# Patient Record
Sex: Male | Born: 1960 | Race: White | Hispanic: No | Marital: Married | Smoking: Never smoker
Health system: Southern US, Community
[De-identification: ages and names within clinical notes are randomized; demographics above are authoritative.]

## PROBLEM LIST (undated history)

## (undated) DIAGNOSIS — S62522A Displaced fracture of distal phalanx of left thumb, initial encounter for closed fracture: Secondary | ICD-10-CM

## (undated) DIAGNOSIS — J309 Allergic rhinitis, unspecified: Secondary | ICD-10-CM

## (undated) DIAGNOSIS — R0683 Snoring: Secondary | ICD-10-CM

## (undated) DIAGNOSIS — T884XXA Failed or difficult intubation, initial encounter: Secondary | ICD-10-CM

## (undated) DIAGNOSIS — T7840XA Allergy, unspecified, initial encounter: Secondary | ICD-10-CM

## (undated) DIAGNOSIS — N529 Male erectile dysfunction, unspecified: Secondary | ICD-10-CM

## (undated) DIAGNOSIS — K4090 Unilateral inguinal hernia, without obstruction or gangrene, not specified as recurrent: Secondary | ICD-10-CM

## (undated) DIAGNOSIS — S0285XA Fracture of orbit, unspecified, initial encounter for closed fracture: Secondary | ICD-10-CM

## (undated) DIAGNOSIS — K5190 Ulcerative colitis, unspecified, without complications: Secondary | ICD-10-CM

## (undated) DIAGNOSIS — E785 Hyperlipidemia, unspecified: Secondary | ICD-10-CM

## (undated) DIAGNOSIS — Z8042 Family history of malignant neoplasm of prostate: Secondary | ICD-10-CM

## (undated) DIAGNOSIS — S62109A Fracture of unspecified carpal bone, unspecified wrist, initial encounter for closed fracture: Secondary | ICD-10-CM

## (undated) DIAGNOSIS — K219 Gastro-esophageal reflux disease without esophagitis: Secondary | ICD-10-CM

## (undated) DIAGNOSIS — Z87442 Personal history of urinary calculi: Secondary | ICD-10-CM

## (undated) DIAGNOSIS — S62102A Fracture of unspecified carpal bone, left wrist, initial encounter for closed fracture: Secondary | ICD-10-CM

## (undated) HISTORY — DX: Ulcerative colitis, unspecified, without complications: K51.90

## (undated) HISTORY — DX: Allergic rhinitis, unspecified: J30.9

## (undated) HISTORY — DX: Failed or difficult intubation, initial encounter: T88.4XXA

## (undated) HISTORY — DX: Male erectile dysfunction, unspecified: N52.9

## (undated) HISTORY — DX: Personal history of urinary calculi: Z87.442

## (undated) HISTORY — DX: Hyperlipidemia, unspecified: E78.5

## (undated) HISTORY — DX: Allergy, unspecified, initial encounter: T78.40XA

## (undated) HISTORY — DX: Gastro-esophageal reflux disease without esophagitis: K21.9

## (undated) HISTORY — DX: Unilateral inguinal hernia, without obstruction or gangrene, not specified as recurrent: K40.90

## (undated) HISTORY — PX: VASECTOMY: SHX75

## (undated) HISTORY — PX: ORIF WRIST FRACTURE: SHX2133

## (undated) HISTORY — DX: Displaced fracture of distal phalanx of left thumb, initial encounter for closed fracture: S62.522A

## (undated) HISTORY — DX: Family history of malignant neoplasm of prostate: Z80.42

## (undated) HISTORY — PX: TONSILLECTOMY AND ADENOIDECTOMY: SUR1326

## (undated) HISTORY — DX: Snoring: R06.83

## (undated) HISTORY — DX: Fracture of unspecified carpal bone, left wrist, initial encounter for closed fracture: S62.102A

---

## 1988-12-29 DIAGNOSIS — K5190 Ulcerative colitis, unspecified, without complications: Secondary | ICD-10-CM

## 1988-12-29 HISTORY — DX: Ulcerative colitis, unspecified, without complications: K51.90

## 1997-07-14 ENCOUNTER — Encounter: Payer: Self-pay | Admitting: Internal Medicine

## 2000-11-24 ENCOUNTER — Ambulatory Visit (HOSPITAL_COMMUNITY): Admission: RE | Admit: 2000-11-24 | Discharge: 2000-11-24 | Payer: Self-pay | Admitting: Gastroenterology

## 2000-11-24 ENCOUNTER — Encounter (INDEPENDENT_AMBULATORY_CARE_PROVIDER_SITE_OTHER): Payer: Self-pay | Admitting: *Deleted

## 2000-11-24 ENCOUNTER — Encounter: Payer: Self-pay | Admitting: Internal Medicine

## 2004-07-29 ENCOUNTER — Emergency Department (HOSPITAL_COMMUNITY): Admission: EM | Admit: 2004-07-29 | Discharge: 2004-07-29 | Payer: Self-pay

## 2007-12-30 HISTORY — PX: URETEROSCOPY: SHX842

## 2008-03-05 ENCOUNTER — Emergency Department (HOSPITAL_COMMUNITY): Admission: EM | Admit: 2008-03-05 | Discharge: 2008-03-05 | Payer: Self-pay | Admitting: Emergency Medicine

## 2008-06-29 ENCOUNTER — Ambulatory Visit (HOSPITAL_BASED_OUTPATIENT_CLINIC_OR_DEPARTMENT_OTHER): Admission: RE | Admit: 2008-06-29 | Discharge: 2008-06-29 | Payer: Self-pay | Admitting: Urology

## 2009-02-07 ENCOUNTER — Encounter: Admission: RE | Admit: 2009-02-07 | Discharge: 2009-02-07 | Payer: Self-pay | Admitting: Internal Medicine

## 2009-11-13 ENCOUNTER — Encounter: Payer: Self-pay | Admitting: Internal Medicine

## 2009-12-29 HISTORY — PX: COLONOSCOPY: SHX174

## 2009-12-29 HISTORY — PX: POLYPECTOMY: SHX149

## 2010-05-13 ENCOUNTER — Encounter (INDEPENDENT_AMBULATORY_CARE_PROVIDER_SITE_OTHER): Payer: Self-pay | Admitting: *Deleted

## 2010-06-20 DIAGNOSIS — Z87442 Personal history of urinary calculi: Secondary | ICD-10-CM | POA: Insufficient documentation

## 2010-06-20 DIAGNOSIS — M25519 Pain in unspecified shoulder: Secondary | ICD-10-CM | POA: Insufficient documentation

## 2010-06-20 DIAGNOSIS — K5190 Ulcerative colitis, unspecified, without complications: Secondary | ICD-10-CM | POA: Insufficient documentation

## 2010-06-26 ENCOUNTER — Ambulatory Visit: Payer: Self-pay | Admitting: Internal Medicine

## 2010-07-03 ENCOUNTER — Telehealth: Payer: Self-pay | Admitting: Internal Medicine

## 2010-07-09 ENCOUNTER — Ambulatory Visit: Payer: Self-pay | Admitting: Internal Medicine

## 2010-07-14 ENCOUNTER — Encounter: Payer: Self-pay | Admitting: Internal Medicine

## 2010-08-07 ENCOUNTER — Telehealth (INDEPENDENT_AMBULATORY_CARE_PROVIDER_SITE_OTHER): Payer: Self-pay | Admitting: *Deleted

## 2011-01-30 NOTE — Assessment & Plan Note (Signed)
Summary: ULCERATIVE COLITIS...establish   History of Present Illness Visit Type: consult Primary GI MD: Yancey Flemings MD Primary Provider: Rodrigo Ran, MD Requesting Provider: Rodrigo Ran, MD Chief Complaint: Pt has a history of UC for 20 yrs with Dr. Sherin Quarry. Pt hasn't had any problems with flares for 10 years but needs a GI doctor for insurance purposes. Pt denies any sx.  History of Present Illness:   22 or old white male diagnosed with ulcerative colitis in 1991 by Dr. Tasia Catchings. Initial symptoms were classic and included abdominal cramping with increased frequency, urgency, and bloody stools. He was initially treated with Azulfidine and prednisone. Allergic reaction to Azulfidine necessitated changing to Dipentum and subsequently Asacol. Review of outside records reveals a colonoscopy in 1998 for screening purposes. He was found to have active disease from the rectum to mid transverse colon. Biopsies confirmed the same without dysplasia. Repeat colonoscopy in November of 2001 revealed active disease from the rectum to mid transverse colon. However biopsies throughout the entire colon showed changes of chronic ulcerative colitis. Pale he has been doing well for multiple years. States she's been on no medications for greater than 5 years. States he said the symptoms for greater than 5 years. He recently wanted to apply for health insurance, but was denied because he was not receiving medical care for his ulcerative colitis. He presents now to establish that care. Review of outside laboratories from November 2010 revealed normal CBC, normal basic metabolic panel, normal lipid profile, normal TSH, normal PSA, normal urinalysis. He denies any extraintestinal manifestations of inflammatory bowel disease. His brother has Crohn's disease. No family history of colon cancer.Marland Kitchen   GI Review of Systems      Denies abdominal pain, acid reflux, belching, bloating, chest pain, dysphagia with liquids, dysphagia  with solids, heartburn, loss of appetite, nausea, vomiting, vomiting blood, weight loss, and  weight gain.        Denies anal fissure, black tarry stools, change in bowel habit, constipation, diarrhea, diverticulosis, fecal incontinence, heme positive stool, hemorrhoids, irritable bowel syndrome, jaundice, light color stool, liver problems, rectal bleeding, and  rectal pain.    Current Medications (verified): 1)  None  Allergies (verified): 1)  ! Sulfa 2)  ! 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  Alcohol Use - no Illicit Drug Use - no seperated with 2 children  Review of Systems  The patient denies allergy/sinus, anemia, anxiety-new, arthritis/joint pain, back pain, blood in urine, breast changes/lumps, change in vision, confusion, cough, coughing up blood, depression-new, fainting, fatigue, fever, headaches-new, hearing problems, heart murmur, heart rhythm changes, itching, menstrual pain, muscle pains/cramps, night sweats, nosebleeds, pregnancy symptoms, shortness of breath, skin rash, sleeping problems, sore throat, swelling of feet/legs, swollen lymph glands, thirst - excessive , urination - excessive , urination changes/pain, urine leakage, vision changes, and voice change.    Vital Signs:  Patient profile:   50 year old male Height:      71 inches Weight:      174.38 pounds BMI:     24.41 Pulse rate:   74 / minute Pulse rhythm:   regular BP sitting:   112 / 70  (left arm) Cuff size:   regular  Vitals Entered By: Christie Nottingham CMA Duncan Dull) (June 26, 2010 2:44  PM)  Physical Exam  General:  Well developed, well nourished, no acute distress. Head:  Normocephalic and atraumatic. Eyes:  PERRLA, no icterus. Ears:  Normal auditory acuity. Nose:  No deformity, discharge,  or lesions. Mouth:  No deformity or lesions, dentition normal. Neck:  Supple; no masses or thyromegaly. Lungs:  Clear throughout to auscultation. Heart:  Regular rate and rhythm; no murmurs, rubs,  or bruits. Abdomen:  Soft, nontender and nondistended. No masses, hepatosplenomegaly or hernias noted. Normal bowel sounds. Rectal:  deferred until colonoscopy Prostate:  deferred until colonoscopy Msk:  Symmetrical with no gross deformities. Normal posture. Pulses:  Normal pulses noted. Extremities:  No clubbing, cyanosis, edema or deformities noted. Neurologic:  Alert and  oriented x4;  grossly normal neurologically. Skin:  Intact without significant lesions or rashes. Psych:  Alert and cooperative. Normal mood and affect.   Impression & Recommendations:  Problem # 1:  ULCERATIVE COLITIS (ICD-556.9) Universal ulcerative colitis diagnosed 20 years ago. The patient had intermittent flares requiring medical therapy. Prior colonoscopies as described. Currently asymptomatic off medical therapy. Normal laboratories last fall. Detailed discussion today on ulcerative colitis as well as it spectrum of disease. Discussed the potential benefits of long-term medical therapy with aminosalicylates. Also discussed the importance of periodic surveillance in patients with long-standing Universal ulcerative colitis. Also discussed the importance of education.  Plan: #1. Reference to Crohn's and colitis Foundation of [place] provided #2. Literature on Crohn's disease and Crohn's medications provided for review #3. Schedule screening colonoscopy with biopsies. The nature of the procedure as well as the risks, benefits, and alternatives were reviewed. He understood and agreed to proceed. #4. Movi prep  prescribed. The patient instructed on its use. #5. Office followup after the above completed  Problem # 2:  SCREENING COLORECTAL-CANCER (ICD-V76.51) high-risk screening to be provided via colonoscopy  Other Orders: Colonoscopy (Colon)  Patient Instructions: 1)  Colonoscopy [facility] 07/09/10 3:30 pm  arrive at 2:30 pm 2)  Movi prep instructions given to patient. 3)  Movi prep Rx. sent to your pharmacy. 4)  Colitis information given. 5)  Copy sent to : Rodrigo Ran, MD 6)  The medication list was reviewed and reconciled.  All changed / newly prescribed medications were explained.  A complete medication list was provided to the patient / caregiver. Prescriptions: MOVIPREP 100 GM  SOLR (PEG-KCL-NACL-NASULF-NA ASC-C) As per prep instructions.  #1 x 0   Entered by:   Milford Cage NCMA   Authorized by:   Hilarie Fredrickson MD   Signed by:   Milford Cage NCMA on 06/26/2010   Method used:   Electronically to        [facility]. [phone]* (retail)       [address]       [place], [place]  [address]       Ph: [phone]       Fax: [phone]   RxID:   [id number]

## 2011-01-30 NOTE — Progress Notes (Signed)
  Phone Note Other Incoming   Request: Send information Summary of Call: Request for records received from [facility]. Request forwarded to [facility].     

## 2011-01-30 NOTE — Miscellaneous (Signed)
Summary: Lialda Rx for colitis  Clinical Lists Changes  Medications: Added new medication of LIALDA 1.2 GM  TBEC (MESALAMINE) one by mouth bid - Signed Rx of LIALDA 1.2 GM  TBEC (MESALAMINE) one by mouth bid;  #60 x 11;  Signed;  Entered by: Doristine Church RN II;  Authorized by: Hilarie Fredrickson MD;  Method used: Electronically to [facility]. [id number]*, [address] [place], [place], [place]  [address], Ph: [phone], Fax: [phone] Observations: Added new observation of ALLERGY REV: Done (07/09/2010 17:35)    Prescriptions: LIALDA 1.2 GM  TBEC (MESALAMINE) one by mouth bid  #60 x 11   Entered by:   Doristine Church RN II   Authorized by:   Hilarie Fredrickson MD   Signed by:   Doristine Church RN II on 07/09/2010   Method used:   Electronically to        [facility]. [phone]* (retail)       [address]       [place], [place]  [address]       Ph: [phone]       Fax: [phone]   RxID:   [id number]

## 2011-01-30 NOTE — Letter (Signed)
Summary: New Patient letter  [facility]  [address] [place], [place] [address]   Phone: [phone]  Fax: [phone]       05/13/2010 MRN: [id number]  Joel Burke [address] [place], [place]  [address]  Dear Mr. Messing,  Welcome to the Gastroenterology Division at [facility].    You are scheduled to see Dr. Marina Goodell on 06/05/2010 at 3:45PM on the 3rd floor at [facility], [address]  We ask that you try to arrive at our office 15 minutes prior to your appointment time to allow for check-in.  We would like you to complete the enclosed self-administered evaluation form prior to your visit and bring it with you on the day of your appointment.  We will review it with you.  Also, please bring a complete list of all your medications or, if you prefer, bring the medication bottles and we will list them.  Please bring your insurance card so that we may make a copy of it.  If your insurance requires a referral to see a specialist, please bring your referral form from your primary care physician.  Co-payments are due at the time of your visit and may be paid by cash, check or credit card.     Your office visit will consist of a consult with your physician (includes a physical exam), any laboratory testing he/she may order, scheduling of any necessary diagnostic testing (e.g. x-ray, ultrasound, CT-scan), and scheduling of a procedure (e.g. Endoscopy, Colonoscopy) if required.  Please allow enough time on your schedule to allow for any/all of these possibilities.    If you cannot keep your appointment, please call [phone] to cancel or reschedule prior to your appointment date.  This allows [place] the opportunity to schedule an appointment for another patient in need of care.  If you do not cancel or reschedule by 5 p.m. the business day prior to your appointment date, you will be charged a $50.00 late cancellation/no-show fee.    Thank you for choosing  [facility] for your medical needs.  We appreciate the opportunity to care for you.  Please visit [place] at our website  to learn more about our practice.                     Sincerely,                                                             The Gastroenterology Division

## 2011-01-30 NOTE — Letter (Signed)
Summary: [facility] Instructions  [facility]  [address] [place], [place] [address]   Phone: [phone]  Fax: [phone]       Joel Burke    1961/04/01    MRN: [id number]        Procedure Day /Date:TUESDAY, 07/09/10     Arrival Time:2:30 PM     Procedure Time:3:30 PM     Location of Procedure:                    [facility] (4th Floor)                        PREPARATION FOR COLONOSCOPY WITH MOVIPREP   Starting 5 days prior to your procedure 07/04/10 do not eat nuts, seeds, popcorn, corn, beans, peas,  salads, or any raw vegetables.  Do not take any fiber supplements (e.g. Metamucil, Citrucel, and Benefiber).  THE DAY BEFORE YOUR PROCEDURE         DATE: 07/08/10  DAY: MONDAY  1.  Drink clear liquids the entire day-NO SOLID FOOD  2.  Do not drink anything colored red or purple.  Avoid juices with pulp.  No orange juice.  3.  Drink at least 64 oz. (8 glasses) of fluid/clear liquids during the day to prevent dehydration and help the prep work efficiently.  CLEAR LIQUIDS INCLUDE: Water Jello Ice Popsicles Tea (sugar ok, no milk/cream) Powdered fruit flavored drinks Coffee (sugar ok, no milk/cream) Gatorade Juice: apple, white grape, white cranberry  Lemonade Clear bullion, consomm, broth Carbonated beverages (any kind) Strained chicken noodle soup Hard Candy                             4.  In the morning, mix first dose of MoviPrep solution:    Empty 1 Pouch A and 1 Pouch B into the disposable container    Add lukewarm drinking water to the top line of the container. Mix to dissolve    Refrigerate (mixed solution should be used within 24 hrs)  5.  Begin drinking the prep at 5:00 p.m. The MoviPrep container is divided by 4 marks.   Every 15 minutes drink the solution down to the next mark (approximately 8 oz) until the full liter is complete.   6.  Follow completed prep with 16 oz of clear liquid of your choice (Nothing red or  purple).  Continue to drink clear liquids until bedtime.  7.  Before going to bed, mix second dose of MoviPrep solution:    Empty 1 Pouch A and 1 Pouch B into the disposable container    Add lukewarm drinking water to the top line of the container. Mix to dissolve    Refrigerate  THE DAY OF YOUR PROCEDURE      DATE:07/09/10 DAY: TUESDAY  Beginning at 10:30 a.m. (5 hours before procedure):         1. Every 15 minutes, drink the solution down to the next mark (approx 8 oz) until the full liter is complete.  2. Follow completed prep with 16 oz. of clear liquid of your choice.    3. You may drink clear liquids until1:30 PM (2 HOURS BEFORE PROCEDURE).   MEDICATION INSTRUCTIONS  Unless otherwise instructed, you should take regular prescription medications with a small sip of water   as early as possible the morning of your procedure.  OTHER INSTRUCTIONS  You will need a responsible adult at least 48 years of age to accompany you and drive you home.   This person must remain in the waiting room during your procedure.  Wear loose fitting clothing that is easily removed.  Leave jewelry and other valuables at home.  However, you may wish to bring a book to read or  an iPod/MP3 player to listen to music as you wait for your procedure to start.  Remove all body piercing jewelry and leave at home.  Total time from sign-in until discharge is approximately 2-3 hours.  You should go home directly after your procedure and rest.  You can resume normal activities the  day after your procedure.  The day of your procedure you should not:   Drive   Make legal decisions   Operate machinery   Drink alcohol   Return to work  You will receive specific instructions about eating, activities and medications before you leave.    The above instructions have been reviewed and explained to me by   _______________________    I fully understand and can verbalize these  instructions _____________________________ Date _________

## 2011-01-30 NOTE — Progress Notes (Signed)
Summary: Primary DX Code  Phone Note Call from Patient Call back at Home Phone [phone]   Caller: Patient Call For: Dr Marina Goodell Reason for Call: Talk to Nurse, Talk to Doctor, Insurance Question Details for Reason: Procedure Coding Summary of Call: Pt called with numerous questions regarding billing, after answering all questions, patient's concern is the primary diagnosis code. Pt states that at office visit, he was told this was a "screening" colonoscopy (V76.51); however, the order reflects ulcerative colitis (556.1) as primary. I advised that often we cannot simply change codes because they are determined based on medical history, but advised patient I would defer to Dr Marina Goodell and his nurse. Please advise. Initial call taken by: Dwan Bolt,  July 03, 2010 1:05 PM  Follow-up for Phone Call        Morrie Sheldon, the patient is correct. The primary code should be V76.51screening. The ulcerative colitis code (556.1) should be secondary. Please contact the patient and let him know. Please correct the coding, if it needs corrected. If there are any further problems, let me know. Thanks. Follow-up by: Hilarie Fredrickson MD,  July 03, 2010 1:49 PM  Additional Follow-up for Phone Call Additional follow up Details #1::        Spoke with patient, verbalized understanding. Benefit information updated to reflect V76.51 as primary dx code. Additional Follow-up by: Dwan Bolt,  July 03, 2010 2:12 PM

## 2011-01-30 NOTE — Procedures (Signed)
Summary: Colonoscopy/[place]  Colonoscopy/[place]   Imported By: Sherian Rein 07/04/2010 07:30:18  _____________________________________________________________________  External Attachment:    Type:   Image     Comment:   External Document

## 2011-01-30 NOTE — Procedures (Signed)
Summary: Colonoscopy/[place]  Colonoscopy/[place]   Imported By: Sherian Rein 07/04/2010 07:32:27  _____________________________________________________________________  External Attachment:    Type:   Image     Comment:   External Document

## 2011-01-30 NOTE — Procedures (Signed)
Summary: Colonoscopy  Patient: [name] Note: All result statuses are Final unless otherwise noted.  Tests: (1) Colonoscopy (COL)   COL Colonoscopy           DONE     [facility]     [address]     [place], [place]  [address]           COLONOSCOPY PROCEDURE REPORT           PATIENT:  Joel Burke, Joel Burke  MR#:  [id number]     BIRTHDATE:  Mar 29, 1961, 48 yrs. old  GENDER:  male     ENDOSCOPIST:  Wilhemina Bonito. Eda Keys, MD     REF. BY:  Office / Self     PROCEDURE DATE:  07/09/2010     PROCEDURE:  Colonoscopy with biopsies,     Colonoscopy with snare polypectomy     x 2     ASA CLASS:  Class II     INDICATIONS:  screening, evaluation of ulcerative colitis ; Dx pan     ulcerative colitis in 1991; last colonoscopy 2001     MEDICATIONS:   Fentanyl 100 mcg IV, Versed 9 mg IV           DESCRIPTION OF PROCEDURE:   After the risks benefits and     alternatives of the procedure were thoroughly explained, informed     consent was obtained.  Digital rectal exam was performed and     revealed no abnormalities.   The LB [id number] endoscope     was introduced through the anus and advanced to the cecum, which     was identified by both the appendix and ileocecal valve, without     limitations.Time to cecum = 2:06 min.  The quality of the prep was     excellent, using MoviPrep.  The instrument was then slowly     withdrawn (time = 19:04 min) as the colon was fully examined.     <<PROCEDUREIMAGES>>           FINDINGS:  The terminal ileum was intubated x 15cm and  appeared     normal.  A mild patchy Colitis was found in the cecum, ascending     colon, and hepatic flexure region.  Two polyps were found - 3mm     sessile in the cecum and 7mm sessile in the ascending colon.     Polyps were snared without cautery. Retrieval was successful.     The transverse colon, splenic flexure, descending, sigmoid colon, and     rectum appeared grossly unremarkable.   Retroflexed views in  the     rectum revealed no abnormalities.MULTIPLE BIOPSIES WERE TAKEN FROM     ALL AREAS OF THE COLON (4Q q 10cm - N=32).    The scope was then     withdrawn from the patient and the procedure completed.           COMPLICATIONS:  None     ENDOSCOPIC IMPRESSION:     1) Normal terminal ileum     2) Mild patchy Colitis in the right colon     3) Two polyps - removed     4) Normal colon (grossly) otherwise           RECOMMENDATIONS:     1) Await pathology results     2) Lialda 1.2  gm; #60; one PO Bid; 11 refills     3) Routine office follow up in 1 year  4) Follow up colonoscopy in about 2 years           ______________________________     Wilhemina Bonito. Eda Keys, MD           CC:  Rodrigo Ran, MD;The Patient           n.     Rosalie DoctorWilhemina Bonito. Eda Keys at 07/09/2010 05:26 PM           [name], [phone]  Note: An exclamation mark (!) indicates a result that was not dispersed into the flowsheet. Document Creation Date: 07/09/2010 5:27 PM _______________________________________________________________________  (1) Order result status: Final Collection or observation date-time: 07/09/2010 17:04 Requested date-time:  Receipt date-time:  Reported date-time:  Referring Physician:   Ordering Physician: Fransico Setters [phone]) Specimen Source:  Source: Launa Grill Order Number: [id number] Lab site:   Appended Document: Colonoscopy     Procedures Next Due Date:    Colonoscopy: 06/2012

## 2011-01-30 NOTE — Letter (Signed)
Summary: Patient Notice- Colon Biospy Results  [facility]  [address] [place], [place] [address]   Phone: [phone]  Fax: [phone]        July 14, 2010 MRN: [id number]    Joel Burke [address] [place], [place]  [address]    Dear [name],  I am pleased to inform you that the biopsies taken during your recent colonoscopy did not show any evidence of dysplasia or microscopic cancer upon pathologic examination.The biopsies did, however, show active chronic inflammation in the right colon (consistent with the findings at colonoscopy).  Also, the polyps that were removed were benign inflammatory type.   Additional information/recommendations:  1. We started Lialda to reduce inflammation  2. Please plan a routine office visit in one year  3.You should have a repeat colonoscopy examination for this problem           in 2 years.     Please call us if you are having persistent problems or have questions about your condition that have not been fully answered at this time.  Sincerely,  Hilarie Fredrickson MD   This letter has been electronically signed by your physician.  Appended Document: Patient Notice- Colon Biospy Results letter mailed.

## 2011-05-13 NOTE — Op Note (Signed)
Joel Burke, Joel Burke             ACCOUNT NO.:  [id number]   MEDICAL RECORD NO.:  [id number]          PATIENT TYPE:  AMB   LOCATION:  [facility]                         FACILITY:  [facility]   PHYSICIAN:  Sigmund I. Patsi Sears, M.D.DATE OF BIRTH:  1961-08-05   DATE OF PROCEDURE:  06/29/2008  DATE OF DISCHARGE:                               OPERATIVE REPORT   PREOPERATIVE DIAGNOSIS:  Distal left ureteral stone.   POSTOPERATIVE DIAGNOSIS:  Distal left ureteral stone.   PROCEDURES:  1. Cystourethroscopy.  2. Left retrograde pyelogram.  3. Intraoperative fluoroscopy with interpretation.  4. Ureteroscopic stone manipulation.  5. Left 6 x 24 double-J ureteral stent placement.   RESIDENT PHYSICIAN:  Dr. Maudie Flakes.   ANESTHESIA:  General/LMA.   INDICATIONS FOR PROCEDURE:  Mr. Stein is a 50 year old white male with  symptomatic left distal ureteral stone, as he has failed medical  expulsive therapy secondary to persistent uncontrolled discomfort.  Preoperatively, all risks, consequences, benefits, and complications  were discussed with him, and informed consent was obtained.   PROCEDURE IN DETAIL:  The patient was brought to the operating room and  placed in the supine position.  He was correctly identified by his  wristband.  Appropriate time-out was taken.  IV antibiotics were given,  and general esthesia was delivered.  Once adequately anesthetized, he  was placed in the dorsal lithotomy position.  Great care was taken to  minimize the risk of peripheral neuropathy or compartment syndrome.  His  perineum was prepped and draped sterilely.  We began our procedure by  performing rigid cystourethroscopy with a 22-French rigid cystoscopic  sheath and a 12 degrees lens.  Normal saline was our irrigant.  Of note,  his urethral meatus was somewhat narrow, and he did have some urethral  meatal bleeding through the case.  The rest of his urethra was normal in  its course and caliber.  Upon  entering the bladder, clear urine was  identified.  Both ureteral orifices were noted to be in their normal  anatomic position, effluxing clear urine.  We cannulated the left  ureteral orifice with a 6-French open-ended catheter and performed  limited retrograde pyelogram.  The stone was not seen.  However, he did  have ureteral dilation down to the level of the UVJ.  We then advanced a  sensor-tip guidewire through the end-hole catheter and directed it to  the left renal pelvis under direct fluoroscopic guidance.  We then  removed the open-ended catheter, leaving the sensor-tip guidewire  behind.  We removed the cystoscope.  We replaced it with a 7.5-French  semi-rigid ureteroscope.  This was advanced through into his left  ureter.  The distal left ureter was very narrow and required the  assistance of a second guidewire to help safely pass through the  intramuscular ureter that was narrow.  Once past this, the ureter became  quite capacious, and the second wire was not necessary.  Retrograde  passage of the semi-rigid ureteroscope to the level of the UPJ did not  demonstrate a stone.  However, slowly backing it out in an antegrade  fashion revealed the  stone that was quite small yet spiculated.  A  nitinol basket was used to grasp it, and it was removed in an antegrade  fashion with.  We then backloaded the sensor-tip guidewire over the  cystoscope, placed the cystoscope back into the bladder, and  subsequently placed a left 6 x 24 double-J ureteral stent under direct  fluoroscopic guidance.  When appropriately positioned, the guidewire was  removed.  The proximal curl was noted to be in the left renal pelvis,  and the distal coil was noted be in the bladder both fluoroscopically  and cystoscopically.  His bladder was drained, and this the cystoscope  was removed.  This marked the end of our procedure.  He tolerate the  procedure well.  There were no complications.  Dr. Patsi Sears was   present and participated in all aspects of the case.     ______________________________  Maudie Flakes, M.D.      Sigmund I. Patsi Sears, M.D.  Electronically Signed    DW/MEDQ  D:  06/29/2008  T:  06/29/2008  Job:  [id number]

## 2011-07-07 DIAGNOSIS — N529 Male erectile dysfunction, unspecified: Secondary | ICD-10-CM | POA: Insufficient documentation

## 2011-07-07 DIAGNOSIS — E785 Hyperlipidemia, unspecified: Secondary | ICD-10-CM | POA: Insufficient documentation

## 2011-07-07 DIAGNOSIS — Z8042 Family history of malignant neoplasm of prostate: Secondary | ICD-10-CM | POA: Insufficient documentation

## 2011-09-22 LAB — I-STAT 8, (EC8 V) (CONVERTED LAB)
Acid-Base Excess: 1
BUN: 22
HCT: 49
Sodium: 141
TCO2: 27
pCO2, Ven: 40.6 — ABNORMAL LOW

## 2011-09-22 LAB — URINALYSIS, ROUTINE W REFLEX MICROSCOPIC
Bilirubin Urine: NEGATIVE
Protein, ur: 30 — AB

## 2011-09-22 LAB — URINE MICROSCOPIC-ADD ON

## 2011-09-22 LAB — URINE CULTURE: Colony Count: NO GROWTH

## 2011-09-25 LAB — POCT HEMOGLOBIN-HEMACUE: Operator id: 268271

## 2012-04-20 ENCOUNTER — Other Ambulatory Visit: Payer: Self-pay | Admitting: Dermatology

## 2012-08-16 ENCOUNTER — Encounter: Payer: Self-pay | Admitting: Internal Medicine

## 2012-10-05 DIAGNOSIS — K219 Gastro-esophageal reflux disease without esophagitis: Secondary | ICD-10-CM | POA: Insufficient documentation

## 2013-04-07 ENCOUNTER — Encounter: Payer: Self-pay | Admitting: Internal Medicine

## 2013-11-08 DIAGNOSIS — K4090 Unilateral inguinal hernia, without obstruction or gangrene, not specified as recurrent: Secondary | ICD-10-CM | POA: Insufficient documentation

## 2013-11-08 DIAGNOSIS — R0683 Snoring: Secondary | ICD-10-CM | POA: Insufficient documentation

## 2014-08-21 ENCOUNTER — Encounter: Payer: Self-pay | Admitting: Internal Medicine

## 2017-01-07 DIAGNOSIS — Z0000 Encounter for general adult medical examination without abnormal findings: Secondary | ICD-10-CM | POA: Diagnosis not present

## 2017-01-07 DIAGNOSIS — Z23 Encounter for immunization: Secondary | ICD-10-CM | POA: Diagnosis not present

## 2017-01-15 DIAGNOSIS — M79671 Pain in right foot: Secondary | ICD-10-CM | POA: Insufficient documentation

## 2017-01-15 DIAGNOSIS — K4090 Unilateral inguinal hernia, without obstruction or gangrene, not specified as recurrent: Secondary | ICD-10-CM | POA: Diagnosis not present

## 2017-01-15 DIAGNOSIS — Z0000 Encounter for general adult medical examination without abnormal findings: Secondary | ICD-10-CM | POA: Diagnosis not present

## 2017-01-15 DIAGNOSIS — Z1212 Encounter for screening for malignant neoplasm of rectum: Secondary | ICD-10-CM | POA: Diagnosis not present

## 2017-01-15 DIAGNOSIS — Z1389 Encounter for screening for other disorder: Secondary | ICD-10-CM | POA: Diagnosis not present

## 2017-01-15 DIAGNOSIS — Z23 Encounter for immunization: Secondary | ICD-10-CM | POA: Diagnosis not present

## 2017-01-15 DIAGNOSIS — M25519 Pain in unspecified shoulder: Secondary | ICD-10-CM | POA: Diagnosis not present

## 2018-01-28 DIAGNOSIS — Z0000 Encounter for general adult medical examination without abnormal findings: Secondary | ICD-10-CM | POA: Diagnosis not present

## 2018-02-04 DIAGNOSIS — M25512 Pain in left shoulder: Secondary | ICD-10-CM | POA: Diagnosis not present

## 2018-02-04 DIAGNOSIS — Z1389 Encounter for screening for other disorder: Secondary | ICD-10-CM | POA: Diagnosis not present

## 2018-02-04 DIAGNOSIS — Z23 Encounter for immunization: Secondary | ICD-10-CM | POA: Diagnosis not present

## 2018-02-04 DIAGNOSIS — M79671 Pain in right foot: Secondary | ICD-10-CM | POA: Diagnosis not present

## 2018-02-04 DIAGNOSIS — K4090 Unilateral inguinal hernia, without obstruction or gangrene, not specified as recurrent: Secondary | ICD-10-CM | POA: Diagnosis not present

## 2018-02-04 DIAGNOSIS — Z0000 Encounter for general adult medical examination without abnormal findings: Secondary | ICD-10-CM | POA: Diagnosis not present

## 2018-02-11 DIAGNOSIS — Z1212 Encounter for screening for malignant neoplasm of rectum: Secondary | ICD-10-CM | POA: Diagnosis not present

## 2019-04-18 DIAGNOSIS — Z125 Encounter for screening for malignant neoplasm of prostate: Secondary | ICD-10-CM | POA: Diagnosis not present

## 2019-04-20 DIAGNOSIS — Z8042 Family history of malignant neoplasm of prostate: Secondary | ICD-10-CM | POA: Diagnosis not present

## 2019-04-20 DIAGNOSIS — Z0000 Encounter for general adult medical examination without abnormal findings: Secondary | ICD-10-CM | POA: Diagnosis not present

## 2019-04-20 DIAGNOSIS — R82998 Other abnormal findings in urine: Secondary | ICD-10-CM | POA: Diagnosis not present

## 2019-04-20 DIAGNOSIS — M79671 Pain in right foot: Secondary | ICD-10-CM | POA: Diagnosis not present

## 2019-04-20 DIAGNOSIS — E785 Hyperlipidemia, unspecified: Secondary | ICD-10-CM | POA: Diagnosis not present

## 2019-05-03 ENCOUNTER — Other Ambulatory Visit: Payer: Self-pay

## 2019-05-03 ENCOUNTER — Encounter: Payer: Self-pay | Admitting: Internal Medicine

## 2019-05-03 ENCOUNTER — Ambulatory Visit: Payer: 59 | Admitting: Internal Medicine

## 2019-05-03 ENCOUNTER — Ambulatory Visit (INDEPENDENT_AMBULATORY_CARE_PROVIDER_SITE_OTHER): Payer: 59 | Admitting: Internal Medicine

## 2019-05-03 VITALS — Ht 71.0 in | Wt 184.0 lb

## 2019-05-03 DIAGNOSIS — Z1211 Encounter for screening for malignant neoplasm of colon: Secondary | ICD-10-CM | POA: Diagnosis not present

## 2019-05-03 DIAGNOSIS — K5100 Ulcerative (chronic) pancolitis without complications: Secondary | ICD-10-CM | POA: Diagnosis not present

## 2019-05-03 NOTE — Progress Notes (Signed)
HISTORY OF PRESENT ILLNESS:  Joel Burke is a 58 y.o. male referred by [name] to reestablish regarding management of chronic ulcerative colitis.  Patient was diagnosed with pan ulcerative colitis in 1991 by Dr. Cletis Athens.  Colonoscopy in November 2001 revealed chronic ulcerative colitis grossly in the left half of the colon and microscopically throughout without dysplasia.  Patient subsequently transferred his care to this office.  I performed a complete colonoscopy July 09, 2010.  The terminal ileum was normal.  There was patchy colitis in the right colon.  The colon was otherwise normal grossly.  2 diminutive polyps were removed and found to be inflammatory pseudopolyps.  Biopsies throughout the colon revealed quiesced sent chronic colitis negative for dysplasia with some more mild to moderately active chronic disease in the right colon.  He was prescribed Lialda 2.4 g daily and told to follow-up in this office in 1 year.  It appears that he was lost to follow-up.  Patient tells me that as far as he can recall he has had no problem with his colitis in many years and has been on no medication probably since shortly after his colonoscopy.  He recently saw his primary care provider.  I have reviewed that note.  He was encouraged to reestablish care with this office.  Testing February 2019 for occult blood was negative.  Review of his blood work finds normal comprehensive metabolic panel and normal CBC with hemoglobin 15.8.  Review of x-ray file shows abdominal ultrasound 2012 with nonobstructing right renal stone.  REVIEW OF SYSTEMS:  All non-GI ROS entirely negative unless otherwise stated in the HPI   Past Medical History:  Diagnosis Date  . Family history of prostate cancer   . GERD (gastroesophageal reflux disease)   . UC (ulcerative colitis) [facility])     Past Surgical History:  Procedure Laterality Date  . COLONOSCOPY  2011  . 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 He has never used smokeless tobacco. He reports current alcohol use of about 4.0 - 6.0 standard drinks of alcohol per week. He reports that he does not use drugs.  family history includes Colitis in his brother; Crohn's disease in his brother; Hyperlipidemia in his mother; Prostate cancer in his father; Spondyloarthropathy in his brother.  Allergies  Allergen Reactions  . Sulfasalazine     REACTION: rash  . Sulfonamide Derivatives     REACTION: hives       PHYSICAL EXAMINATION: No physical examination with telemedicine visit   ASSESSMENT:  1.  Longstanding pan ulcerative colitis (diagnosed 1991).  Lost to follow-up.  Currently asymptomatic with normal laboratories.  On no medical therapy.  Last examination 2011 with quiesced sent and mild to moderately active disease as described. 2.  Colon cancer screening.  Patient is higher than baseline risk for colon cancer given his chronic ulcerative colitis   PLAN:  1.  SCHEDULE IN THE [facility] colonoscopy with biopsies.The nature of the procedure, as well as the risks, benefits, and alternatives were carefully and thoroughly reviewed with the patient. Ample time for discussion and questions allowed. The patient understood, was satisfied, and agreed to proceed. 2.  Decisions regarding medical therapy and surveillance to be made after the results of his colonoscopy with biopsies are known. This telehealth WebEx audiovisual visit was scheduled by the patient and consented for by the patient.  Due to technical difficulties on the patient's  end of the visit was converted to telephone only.  In any event, he was in his home and I was in my office during the encounter.  He understands her may be associated professional charges.  A copy of this consultation note has been sent to Dr. Joylene Draft

## 2019-05-04 NOTE — Progress Notes (Signed)
This patient apparently has 2 charts that need merged

## 2019-05-05 ENCOUNTER — Encounter: Payer: Self-pay | Admitting: Internal Medicine

## 2019-05-09 ENCOUNTER — Telehealth: Payer: Self-pay

## 2019-05-09 NOTE — Telephone Encounter (Signed)
I have called patient and left several messages since his visit regarding scheduling his procedure.  I have not heard back from patient.  I left a message today stating to please let me know if he wants to have procedure done this month or to call back and let me know if he wants to wait until a later date and I will let Dr. Henrene Pastor know.

## 2020-05-16 DIAGNOSIS — R7301 Impaired fasting glucose: Secondary | ICD-10-CM | POA: Insufficient documentation

## 2020-11-18 ENCOUNTER — Emergency Department (HOSPITAL_BASED_OUTPATIENT_CLINIC_OR_DEPARTMENT_OTHER): Payer: 59

## 2020-11-18 ENCOUNTER — Observation Stay (HOSPITAL_BASED_OUTPATIENT_CLINIC_OR_DEPARTMENT_OTHER)
Admission: EM | Admit: 2020-11-18 | Discharge: 2020-11-19 | Disposition: A | Discharge status: Home / Self Care | Payer: 59 | Attending: Surgery | Admitting: Surgery

## 2020-11-18 ENCOUNTER — Other Ambulatory Visit: Payer: Self-pay

## 2020-11-18 ENCOUNTER — Encounter (HOSPITAL_BASED_OUTPATIENT_CLINIC_OR_DEPARTMENT_OTHER): Payer: Self-pay | Admitting: Emergency Medicine

## 2020-11-18 DIAGNOSIS — S4992XA Unspecified injury of left shoulder and upper arm, initial encounter: Secondary | ICD-10-CM | POA: Diagnosis present

## 2020-11-18 DIAGNOSIS — W11XXXA Fall on and from ladder, initial encounter: Secondary | ICD-10-CM

## 2020-11-18 DIAGNOSIS — Z79899 Other long term (current) drug therapy: Secondary | ICD-10-CM | POA: Diagnosis not present

## 2020-11-18 DIAGNOSIS — S42012A Anterior displaced fracture of sternal end of left clavicle, initial encounter for closed fracture: Secondary | ICD-10-CM | POA: Diagnosis not present

## 2020-11-18 DIAGNOSIS — J939 Pneumothorax, unspecified: Secondary | ICD-10-CM

## 2020-11-18 DIAGNOSIS — Z20822 Contact with and (suspected) exposure to covid-19: Secondary | ICD-10-CM | POA: Diagnosis not present

## 2020-11-18 DIAGNOSIS — S2242XA Multiple fractures of ribs, left side, initial encounter for closed fracture: Secondary | ICD-10-CM | POA: Diagnosis not present

## 2020-11-18 DIAGNOSIS — Y9389 Activity, other specified: Secondary | ICD-10-CM | POA: Insufficient documentation

## 2020-11-18 DIAGNOSIS — S27321A Contusion of lung, unilateral, initial encounter: Secondary | ICD-10-CM

## 2020-11-18 DIAGNOSIS — S270XXA Traumatic pneumothorax, initial encounter: Secondary | ICD-10-CM | POA: Diagnosis not present

## 2020-11-18 DIAGNOSIS — S2249XA Multiple fractures of ribs, unspecified side, initial encounter for closed fracture: Secondary | ICD-10-CM | POA: Diagnosis present

## 2020-11-18 DIAGNOSIS — W19XXXA Unspecified fall, initial encounter: Secondary | ICD-10-CM

## 2020-11-18 DIAGNOSIS — S42009A Fracture of unspecified part of unspecified clavicle, initial encounter for closed fracture: Secondary | ICD-10-CM | POA: Diagnosis present

## 2020-11-18 LAB — CBC WITH DIFFERENTIAL/PLATELET
Abs Immature Granulocytes: 0.05 10*3/uL (ref 0.00–0.07)
Basophils Absolute: 0.1 10*3/uL (ref 0.0–0.1)
Basophils Relative: 0 %
Eosinophils Absolute: 0 10*3/uL (ref 0.0–0.5)
Eosinophils Relative: 0 %
HCT: 46.2 % (ref 39.0–52.0)
Hemoglobin: 15.3 g/dL (ref 13.0–17.0)
Immature Granulocytes: 0 %
Lymphocytes Relative: 5 %
Lymphs Abs: 0.8 10*3/uL (ref 0.7–4.0)
MCH: 28.9 pg (ref 26.0–34.0)
MCHC: 33.1 g/dL (ref 30.0–36.0)
MCV: 87.3 fL (ref 80.0–100.0)
Monocytes Absolute: 0.7 10*3/uL (ref 0.1–1.0)
Monocytes Relative: 5 %
Neutro Abs: 13.5 10*3/uL — ABNORMAL HIGH (ref 1.7–7.7)
Neutrophils Relative %: 90 %
Platelets: 224 10*3/uL (ref 150–400)
RBC: 5.29 MIL/uL (ref 4.22–5.81)
RDW: 13.5 % (ref 11.5–15.5)
WBC: 15.1 10*3/uL — ABNORMAL HIGH (ref 4.0–10.5)
nRBC: 0 % (ref 0.0–0.2)

## 2020-11-18 LAB — COMPREHENSIVE METABOLIC PANEL
ALT: 33 U/L (ref 0–44)
AST: 36 U/L (ref 15–41)
Albumin: 3.9 g/dL (ref 3.5–5.0)
Alkaline Phosphatase: 62 U/L (ref 38–126)
Anion gap: 10 (ref 5–15)
BUN: 16 mg/dL (ref 6–20)
CO2: 23 mmol/L (ref 22–32)
Calcium: 8.3 mg/dL — ABNORMAL LOW (ref 8.9–10.3)
Chloride: 106 mmol/L (ref 98–111)
Creatinine, Ser: 1.03 mg/dL (ref 0.61–1.24)
GFR, Estimated: >60 mL/min (ref >60)
Glucose, Bld: 104 mg/dL — ABNORMAL HIGH (ref 70–99)
Potassium: 3.6 mmol/L (ref 3.5–5.1)
Sodium: 139 mmol/L (ref 135–145)
Total Bilirubin: 0.7 mg/dL (ref 0.3–1.2)
Total Protein: 6.5 g/dL (ref 6.5–8.1)

## 2020-11-18 LAB — LIPASE, BLOOD: Lipase: 20 U/L (ref 11–51)

## 2020-11-18 LAB — RESPIRATORY PANEL BY RT PCR (FLU A&B, COVID)
Influenza A by PCR: NEGATIVE
Influenza B by PCR: NEGATIVE
SARS Coronavirus 2 by RT PCR: NEGATIVE

## 2020-11-18 LAB — URINALYSIS, ROUTINE W REFLEX MICROSCOPIC
Bilirubin Urine: NEGATIVE
Glucose, UA: NEGATIVE mg/dL
Hgb urine dipstick: NEGATIVE
Ketones, ur: NEGATIVE mg/dL
Leukocytes,Ua: NEGATIVE
Nitrite: NEGATIVE
Protein, ur: NEGATIVE mg/dL
Specific Gravity, Urine: 1.025 (ref 1.005–1.030)
pH: 6 (ref 5.0–8.0)

## 2020-11-18 LAB — PROTIME-INR
INR: 1 (ref 0.8–1.2)
Prothrombin Time: 13 seconds (ref 11.4–15.2)

## 2020-11-18 MED ORDER — OXYCODONE HCL 5 MG PO TABS
5.0000 mg | ORAL_TABLET | ORAL | Status: DC | PRN
  Administered 2020-11-18 – 2020-11-19 (×2): 10 mg via ORAL
  Filled 2020-11-18 (×2): qty 2

## 2020-11-18 MED ORDER — ONDANSETRON 4 MG PO TBDP: 4.0000 mg | ORAL_TABLET | Freq: Four times a day (QID) | ORAL | Status: DC | PRN

## 2020-11-18 MED ORDER — SODIUM CHLORIDE 0.9 % IV SOLN: Freq: Once | INTRAVENOUS | Status: AC

## 2020-11-18 MED ORDER — ONDANSETRON HCL 4 MG/2ML IJ SOLN: 4.0000 mg | Freq: Four times a day (QID) | INTRAMUSCULAR | Status: DC | PRN

## 2020-11-18 MED ORDER — SODIUM CHLORIDE 0.9 % IV BOLUS: 1000.0000 mL | Freq: Once | INTRAVENOUS | Status: DC

## 2020-11-18 MED ORDER — HYDROMORPHONE HCL 1 MG/ML IJ SOLN: 1.0000 mg | INTRAMUSCULAR | Status: DC | PRN

## 2020-11-18 MED ORDER — IOHEXOL 300 MG/ML  SOLN
100.0000 mL | Freq: Once | INTRAMUSCULAR | Status: AC | PRN
  Administered 2020-11-18: 100 mL via INTRAVENOUS

## 2020-11-18 MED ORDER — HYDROMORPHONE HCL 1 MG/ML IJ SOLN
1.0000 mg | Freq: Once | INTRAMUSCULAR | Status: AC
  Administered 2020-11-18: 1 mg via INTRAVENOUS
  Filled 2020-11-18: qty 1

## 2020-11-18 MED ORDER — DOCUSATE SODIUM 100 MG PO CAPS
100.0000 mg | ORAL_CAPSULE | Freq: Two times a day (BID) | ORAL | Status: DC
  Administered 2020-11-18 – 2020-11-19 (×2): 100 mg via ORAL
  Filled 2020-11-18 (×3): qty 1

## 2020-11-18 MED ORDER — ENOXAPARIN SODIUM 30 MG/0.3ML ~~LOC~~ SOLN
30.0000 mg | Freq: Two times a day (BID) | SUBCUTANEOUS | Status: DC
  Administered 2020-11-19: 30 mg via SUBCUTANEOUS
  Filled 2020-11-18: qty 0.3

## 2020-11-18 MED ORDER — ACETAMINOPHEN 325 MG PO TABS
650.0000 mg | ORAL_TABLET | Freq: Four times a day (QID) | ORAL | Status: DC
  Administered 2020-11-18 – 2020-11-19 (×2): 650 mg via ORAL
  Filled 2020-11-18 (×2): qty 2

## 2020-11-18 MED ORDER — GABAPENTIN 300 MG PO CAPS
300.0000 mg | ORAL_CAPSULE | Freq: Three times a day (TID) | ORAL | Status: DC
  Administered 2020-11-18 – 2020-11-19 (×3): 300 mg via ORAL
  Filled 2020-11-18 (×4): qty 1

## 2020-11-18 MED ORDER — ATORVASTATIN CALCIUM 10 MG PO TABS
10.0000 mg | ORAL_TABLET | Freq: Every day | ORAL | Status: DC
  Administered 2020-11-18 – 2020-11-19 (×2): 10 mg via ORAL
  Filled 2020-11-18 (×2): qty 1

## 2020-11-18 NOTE — ED Triage Notes (Signed)
Pt transferred here from [place] d/t a fall from 6 ft while hanging Christmas Lights. Pt has a fractured Left clavicle, left sided ribs 1-7 & a reported small left sided pneumo. Pt arrives to ED A/Ox4, verbal-able to make needs known.

## 2020-11-18 NOTE — ED Notes (Signed)
Pt given incentive spirometer and educated on use. Pt verbalized understanding

## 2020-11-18 NOTE — ED Notes (Signed)
I have managed to obtain IV, however, we have not been able to obtain blood sample as of yet. We will continue to pursue this. His wife remains at bedside. He tells me smilingly that he feels "much better".

## 2020-11-18 NOTE — ED Notes (Signed)
At this time, he is loaded onto the [facility] truck for transport without incident.

## 2020-11-18 NOTE — ED Notes (Signed)
Nurse attemptiong IV, Will obtain EKG when PT is free due to pain when positioning for EKG

## 2020-11-18 NOTE — ED Provider Notes (Addendum)
  Physical Exam  BP 133/79 (BP Location: Right Arm)   Pulse 82   Temp 98.2 F (36.8 C) (Oral)   Resp 16   Ht 5\' 10"  (1.778 m)   Wt 85.3 kg   SpO2 99%   BMI 26.98 kg/m   Physical Exam  ED Course/Procedures     Procedures  MDM  Patient care assumed at 3 pm.  Patient had a fall off of a ladder.  Patient had a clavicle fracture and some rib fractures on x-ray.  Signout pending trauma scan.  5:53 PM CT showed rib fractures 1 through 7 with a small pneumothorax.  Patient has no oxygen requirement right now. I discussed with Dr. Zenia Resides from trauma.  She requested ED to ED transfer and will see patient in the [facility] ER. Dr. Billy Fischer will be accepting doctor.   CRITICAL CARE Performed by: Wandra Arthurs   Total critical care time: 30 minutes  Critical care time was exclusive of separately billable procedures and treating other patients.  Critical care was necessary to treat or prevent imminent or life-threatening deterioration.  Critical care was time spent personally by me on the following activities: development of treatment plan with patient and/or surrogate as well as nursing, discussions with consultants, evaluation of patient's response to treatment, examination of patient, obtaining history from patient or surrogate, ordering and performing treatments and interventions, ordering and review of laboratory studies, ordering and review of radiographic studies, pulse oximetry and re-evaluation of patient's condition.    Angiocath insertion Performed by: Wandra Arthurs  Consent: Verbal consent obtained. Risks and benefits: risks, benefits and alternatives were discussed Time out: Immediately prior to procedure a "time out" was called to verify the correct patient, procedure, equipment, support staff and site/side marked as required.  Preparation: Patient was prepped and draped in the usual sterile fashion.  Vein Location: R brachial  Ultrasound Guided  Gauge: 20 long   Normal  blood return and flush without difficulty Patient tolerance: Patient tolerated the procedure well with no immediate complications.       Drenda Freeze, MD 11/18/20 1755    Drenda Freeze, MD 11/18/20 [phone]

## 2020-11-18 NOTE — ED Notes (Signed)
I have just given report to Roselyn Reef, RN at [facility] ED. [facility] tells [place] they are ~15 min. Out.
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
## 2020-11-18 NOTE — ED Notes (Signed)
Pt OOB to bathroom

## 2020-11-18 NOTE — ED Triage Notes (Signed)
Pt fell 6-8 feet off of a ladder while putting up christmas lights. Pt c/o L shoulder pain and back pain. Denies LOC.
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
## 2020-11-18 NOTE — ED Notes (Signed)
ED Provider at bedside. 

## 2020-11-18 NOTE — ED Notes (Signed)
Attempted to call charge RN at [facility]. No answer. Will call again.

## 2020-11-18 NOTE — ED Notes (Signed)
2 unsuccessful IV attempts. Tim Therapist, sports at bedside

## 2020-11-18 NOTE — ED Notes (Signed)
Unsuccessful phlebotomy attempt to collect blood labs from left hand.

## 2020-11-18 NOTE — ED Notes (Signed)
We have employed hot packs to foster the blood work. Will re-attempt shortly.

## 2020-11-18 NOTE — ED Notes (Signed)
He is awake, alert and oriented x 4 with clear speech. Two of [place] have attempted to gain IV access--unsuccessfully. Will attempt again after EKG.CXR.

## 2020-11-18 NOTE — ED Notes (Signed)
Pt given food and drink with provider okay. 

## 2020-11-19 ENCOUNTER — Encounter: Payer: Self-pay | Admitting: General Surgery

## 2020-11-19 ENCOUNTER — Observation Stay (HOSPITAL_COMMUNITY): Payer: 59

## 2020-11-19 LAB — HIV ANTIBODY (ROUTINE TESTING W REFLEX): HIV Screen 4th Generation wRfx: NONREACTIVE

## 2020-11-19 MED ORDER — METHOCARBAMOL 750 MG PO TABS
750.0000 mg | ORAL_TABLET | Freq: Three times a day (TID) | ORAL | 1 refills | Status: DC | PRN
Start: 2020-11-19 — End: 2020-12-10

## 2020-11-19 MED ORDER — METHOCARBAMOL 750 MG PO TABS: 750.0000 mg | ORAL_TABLET | Freq: Three times a day (TID) | ORAL | Status: DC | PRN

## 2020-11-19 MED ORDER — LIDOCAINE 5 % EX PTCH: 1.0000 | MEDICATED_PATCH | Freq: Every day | CUTANEOUS | 1 refills | Status: DC

## 2020-11-19 MED ORDER — OXYCODONE HCL 5 MG PO TABS: 5.0000 mg | ORAL_TABLET | Freq: Four times a day (QID) | ORAL | 0 refills | Status: DC | PRN

## 2020-11-19 MED ORDER — ACETAMINOPHEN 500 MG PO TABS
1000.0000 mg | ORAL_TABLET | Freq: Four times a day (QID) | ORAL | Status: DC
  Administered 2020-11-19 (×2): 1000 mg via ORAL
  Filled 2020-11-19 (×2): qty 2

## 2020-11-19 MED ORDER — ACETAMINOPHEN 500 MG PO TABS: 1000.0000 mg | ORAL_TABLET | Freq: Four times a day (QID) | ORAL | 0 refills | Status: AC | PRN

## 2020-11-19 MED ORDER — IBUPROFEN 200 MG PO TABS
400.0000 mg | ORAL_TABLET | Freq: Four times a day (QID) | ORAL | 0 refills | Status: DC | PRN
Start: 2020-11-19 — End: 2021-08-12

## 2020-11-19 MED ORDER — DOCUSATE SODIUM 100 MG PO CAPS
100.0000 mg | ORAL_CAPSULE | Freq: Two times a day (BID) | ORAL | 0 refills | Status: DC
Start: 2020-11-19 — End: 2021-08-12

## 2020-11-19 MED ORDER — LIDOCAINE 5 % EX PTCH
1.0000 | MEDICATED_PATCH | Freq: Every day | CUTANEOUS | Status: DC
  Administered 2020-11-19: 1 via TRANSDERMAL
  Filled 2020-11-19: qty 1

## 2020-11-19 NOTE — TOC CAGE-AID Note (Signed)
Transition of Care [facility]) - CAGE-AID Screening   Patient Details  Name: Joel Burke MRN: [id number] Date of Birth: 04-20-1961  Transition of Care [facility]) CM/SW Contact:    Emeterio Reeve, [place] Phone Number: 11/19/2020, 4:35 PM   Clinical Narrative:  CSW met with pt at bedside. CSW introduced self and explained role at the hospital.  Pt reports weekly alcohol use. Pt denies substance use. Pt did not need any resources at this time.    CAGE-AID Screening:    Have You Ever Felt You Ought to Cut Down on Your Drinking or Drug Use?: No Have People Annoyed You By Critizing Your Drinking Or Drug Use?: No Have You Felt Bad Or Guilty About Your Drinking Or Drug Use?: No Have You Ever Had a Drink or Used Drugs First Thing In The Morning to Steady Your Nerves or to Get Rid of a Hangover?: No CAGE-AID Score: 0  Substance Abuse Education Offered: Yes    Blima Ledger, [place] Social Worker [phone]

## 2020-11-19 NOTE — Plan of Care (Signed)
Patient for discharge to home. Education provided.  Problem: Education: Goal: Knowledge of General Education information will improve Description: Including pain rating scale, medication(s)/side effects and non-pharmacologic comfort measures Outcome: Adequate for Discharge   Problem: Health Behavior/Discharge Planning: Goal: Ability to manage health-related needs will improve Outcome: Adequate for Discharge   Problem: Clinical Measurements: Goal: Ability to maintain clinical measurements within normal limits will improve Outcome: Adequate for Discharge Goal: Will remain free from infection Outcome: Adequate for Discharge Goal: Diagnostic test results will improve Outcome: Adequate for Discharge Goal: Respiratory complications will improve Outcome: Adequate for Discharge Goal: Cardiovascular complication will be avoided Outcome: Adequate for Discharge   Problem: Activity: Goal: Risk for activity intolerance will decrease Outcome: Adequate for Discharge   Problem: Nutrition: Goal: Adequate nutrition will be maintained Outcome: Adequate for Discharge   Problem: Coping: Goal: Level of anxiety will decrease Outcome: Adequate for Discharge   Problem: Elimination: Goal: Will not experience complications related to bowel motility Outcome: Adequate for Discharge Goal: Will not experience complications related to urinary retention Outcome: Adequate for Discharge   Problem: Pain Managment: Goal: General experience of comfort will improve Outcome: Adequate for Discharge   Problem: Safety: Goal: Ability to remain free from injury will improve Outcome: Adequate for Discharge   Problem: Skin Integrity: Goal: Risk for impaired skin integrity will decrease Outcome: Adequate for Discharge
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
## 2020-11-19 NOTE — Discharge Summary (Signed)
[facility] Discharge Summary   Patient ID: Joel Burke MRN: [id number] DOB/AGE: [birth year] 59 y.o.  Admit date: 11/18/2020 Discharge date: 11/19/2020   Discharge Diagnosis Patient Active Problem List   Diagnosis Date Noted  . Rib fractures 11/18/2020  . Clavicle fracture 11/18/2020  . Left pulmonary contusion 11/18/2020  . Fall from ladder 11/18/2020  . ULCERATIVE COLITIS 06/20/2010  . SHOULDER PAIN, LEFT 06/20/2010  . RENAL CALCULUS, HX OF 06/20/2010    Consultants Orthopedic surgery - Dr. Alvan Dame   Imaging: CT Head Wo Contrast  Result Date: 11/18/2020 CLINICAL DATA:  Fall from ladder EXAM: CT HEAD WITHOUT CONTRAST TECHNIQUE: Contiguous axial images were obtained from the base of the skull through the vertex without intravenous contrast. COMPARISON:  None. FINDINGS: Brain: No evidence of acute territorial infarction, hemorrhage, hydrocephalus,extra-axial collection or mass lesion/mass effect. Normal gray-white differentiation. Ventricles are normal in size and contour. Vascular: No hyperdense vessel or unexpected calcification. Skull: The skull is intact. No fracture or focal lesion identified. Sinuses/Orbits: The visualized paranasal sinuses and mastoid air cells are clear. The orbits and globes intact. Other: None Cervical spine: Alignment: There is straightening of the normal cervical lordosis. Skull base and vertebrae: Visualized skull base is intact. No atlanto-occipital dissociation. The vertebral body heights are well maintained. No fracture or pathologic osseous lesion seen. Soft tissues and spinal canal: The visualized paraspinal soft tissues are unremarkable. No prevertebral soft tissue swelling is seen. The spinal canal is grossly unremarkable, no large epidural collection or significant canal narrowing. Disc levels: Cervical spine spondylosis is seen with anterior osteophytes disc osteophyte complex and uncovertebral osteophytes most notable at C4-C5 and  C5-C6 with severe neural foraminal narrowing and mild central canal stenosis. Upper chest: The lung apices are clear. Thoracic inlet is within normal limits. Other: None IMPRESSION: No acute intracranial abnormality. No acute fracture or malalignment of the spine. Electronically Signed   By: Prudencio Pair M.D.   On: 11/18/2020 17:04   CT Cervical Spine Wo Contrast  Result Date: 11/18/2020 CLINICAL DATA:  Fall from ladder EXAM: CT HEAD WITHOUT CONTRAST TECHNIQUE: Contiguous axial images were obtained from the base of the skull through the vertex without intravenous contrast. COMPARISON:  None. FINDINGS: Brain: No evidence of acute territorial infarction, hemorrhage, hydrocephalus,extra-axial collection or mass lesion/mass effect. Normal gray-white differentiation. Ventricles are normal in size and contour. Vascular: No hyperdense vessel or unexpected calcification. Skull: The skull is intact. No fracture or focal lesion identified. Sinuses/Orbits: The visualized paranasal sinuses and mastoid air cells are clear. The orbits and globes intact. Other: None Cervical spine: Alignment: There is straightening of the normal cervical lordosis. Skull base and vertebrae: Visualized skull base is intact. No atlanto-occipital dissociation. The vertebral body heights are well maintained. No fracture or pathologic osseous lesion seen. Soft tissues and spinal canal: The visualized paraspinal soft tissues are unremarkable. No prevertebral soft tissue swelling is seen. The spinal canal is grossly unremarkable, no large epidural collection or significant canal narrowing. Disc levels: Cervical spine spondylosis is seen with anterior osteophytes disc osteophyte complex and uncovertebral osteophytes most notable at C4-C5 and C5-C6 with severe neural foraminal narrowing and mild central canal stenosis. Upper chest: The lung apices are clear. Thoracic inlet is within normal limits. Other: None IMPRESSION: No acute intracranial  abnormality. No acute fracture or malalignment of the spine. Electronically Signed   By: Prudencio Pair M.D.   On: 11/18/2020 17:04   CT CHEST ABDOMEN PELVIS W CONTRAST  Result Date: 11/18/2020 CLINICAL DATA:  Fall 6 ft from ladder landing on left side. Rib fractures and clavicle fracture on plain film EXAM: CT CHEST, ABDOMEN, AND PELVIS WITH CONTRAST TECHNIQUE: Multidetector CT imaging of the chest, abdomen and pelvis was performed following the standard protocol during bolus administration of intravenous contrast. CONTRAST:  164mL OMNIPAQUE IOHEXOL 300 MG/ML  SOLN COMPARISON:  Chest x-ray today.  CT abdomen and pelvis 03/05/2008 FINDINGS: CT CHEST FINDINGS Cardiovascular: Heart is normal size. Aorta is normal caliber. No aortic injury. Mediastinum/Nodes: No mediastinal, hilar, or axillary adenopathy. Trachea and esophagus are unremarkable. Thyroid unremarkable. No mediastinal hematoma. Lungs/Pleura: Mild ground-glass opacities noted in the left lower lobe and inferior lingula, favor atelectasis. Very small left pneumothorax noted medially in the left upper chest and at the left base anteriorly. Minimal dependent atelectasis in both lower lungs. Musculoskeletal: Fractures through the 1st through 7th ribs posteriorly. No thoracic spine fracture. Chest wall soft tissues are unremarkable. CT ABDOMEN PELVIS FINDINGS Hepatobiliary: No hepatic injury or perihepatic hematoma. Gallbladder is unremarkable Pancreas: No focal abnormality or ductal dilatation. Spleen: No splenic injury or perisplenic hematoma. Adrenals/Urinary Tract: No adrenal hemorrhage or renal injury identified. Bladder is unremarkable. Stomach/Bowel: Normal appendix. Stomach, large and small bowel grossly unremarkable. Vascular/Lymphatic: No evidence of aneurysm or adenopathy. Reproductive: Mildly prominent prostate. Other: No free fluid or free air. Musculoskeletal: No acute bony abnormality. Degenerative disc disease most pronounced at L4-5.  IMPRESSION: Fractures through the left posterior 1st through 7th ribs. Associated tiny left pneumothorax. Left lower lobe and lingular ground-glass opacities felt to most likely reflect atelectasis. No evidence of solid organ injury within the abdomen. Prostate enlargement. Electronically Signed   By: Rolm Baptise M.D.   On: 11/18/2020 17:00   DG CHEST PORT 1 VIEW  Result Date: 11/19/2020 CLINICAL DATA:  Follow-up left pneumothorax EXAM: PORTABLE CHEST 1 VIEW COMPARISON:  CT chest 11/18/2020.  Chest radiograph 11/18/2020 FINDINGS: Mild cardiac enlargement. No vascular congestion, edema, or consolidation. Developing atelectasis in the left lung base. The left pneumothorax is not demonstrated radiographically. No pleural effusions. Left clavicular and left posterior rib fractures as before. No significant changes. IMPRESSION: Developing atelectasis in the left lung base. Left pneumothorax is not demonstrated radiographically. Electronically Signed   By: Lucienne Capers M.D.   On: 11/19/2020 05:48   DG Chest Port 1 View  Result Date: 11/18/2020 CLINICAL DATA:  Back pain.  Fall from ladder EXAM: PORTABLE CHEST 1 VIEW COMPARISON:  06/29/2008 FINDINGS: The heart size and mediastinal contours are within normal limits. Hazy left basilar opacity. No pleural effusion. No evidence of pneumothorax. Mid left clavicular fracture with one shaft width of inferior displacement. Mildly displaced posterior left third rib fracture. IMPRESSION: 1. Hazy left basilar opacity, which may represent pulmonary contusion or atelectasis versus pneumonia in the appropriate clinical setting. 2. Displaced mid left clavicular fracture. 3. Mildly displaced posterior left third rib fracture. No pneumothorax identified. Electronically Signed   By: Davina Poke D.O.   On: 11/18/2020 15:01    Procedures None  HPI/Hospital course:  Mr. Mizer is a 59 yo male who presents as a transfer from [place] after a fall at home. Earlier  today he was on a ladder hanging Christmas lights when he fell off. He fell about 8 feet and landed on his left side. He says he did not hit his head and denies loss of consciousness. He complains of left shoulder pain and left sided chest pain. He had CT scans of the head, C spine, and chest/abd/pelvis, which  showed left rib fractures, an occult left pneumothorax, pulmonary contusions, and a left clavicle fracture. He was transferred to [facility]. He remains hemodynamically stable and is breathing comfortably on room air.  The patient was admitted for pain control and orthopedic surgery consult. On hospital day#1 follow up chest x-ray showed no pneumothorax and the patient was oxygenating ORA. Orthopedic surgery saw the patient recommended non-operative management of clavicle FX with a sling. On11/22/21, the patient was voiding well, tolerating diet, ambulating well, pain well controlled, vital signs stable, and felt stable for discharge home.  Patient will follow up as below and knows to call with questions or concerns.    Physical Exam: General:  Alert, NAD, pleasant, comfortable CV: RRR Pulm: no chest wall ecchymosis, approp tender over L posterior rib cage, CTAB, pulled 2000 cc on IS. Abd:  Soft, ND, non-tender, +BS  MSK: TTP L clavicle with some edema, appropriate   Allergies as of 11/19/2020      Reactions   Sulfonamide Derivatives Hives   Sulfasalazine Rash      Medication List    TAKE these medications   acetaminophen 500 MG tablet Commonly known as: TYLENOL Take 2 tablets (1,000 mg total) by mouth every 6 (six) hours as needed.   atorvastatin 10 MG tablet Commonly known as: LIPITOR Take 10 mg by mouth daily.   cetirizine 10 MG tablet Commonly known as: ZYRTEC Take 10 mg by mouth daily.   docusate sodium 100 MG capsule Commonly known as: COLACE Take 1 capsule (100 mg total) by mouth 2 (two) times daily.   FISH OIL PO Take 1 capsule by mouth daily.   ibuprofen 200 MG  tablet Commonly known as: ADVIL Take 2 tablets (400 mg total) by mouth every 6 (six) hours as needed (pain, rib fracture pain). What changed:   when to take this  reasons to take this   lidocaine 5 % Commonly known as: LIDODERM Place 1 patch onto the skin daily. Remove & Discard patch within 12 hours or as directed by MD   methocarbamol 750 MG tablet Commonly known as: ROBAXIN Take 1 tablet (750 mg total) by mouth every 8 (eight) hours as needed for muscle spasms (pain from rib fractures).   naproxen sodium 220 MG tablet Commonly known as: ALEVE Take 220 mg by mouth daily as needed (pain).   oxyCODONE 5 MG immediate release tablet Commonly known as: Oxy IR/ROXICODONE Take 1 tablet (5 mg total) by mouth every 6 (six) hours as needed (pain not releived by tylenol or ibuprofen.).         Follow-up Information    Paralee Cancel, MD Follow up in 2 week(s).   Specialty: Orthopedic Surgery Why: for X-ray follow up of left clavicle Contact information: [address] [place] [address] [phone]        [facility] Follow up.   Why: call as needed for questions/concerns regarding rib fractures Contact information: [address] [address] [phone]              Signed: Obie Dredge, [facility] 11/19/2020, 10:06 AM

## 2020-11-19 NOTE — Discharge Instructions (Signed)
Sling for the first 3-4 weeks to splint left clavicle Continue to work on deep breathing to prevent pneumonia associated with his rib fractures   Clavicle Fracture  A clavicle fracture is a broken collarbone. The collarbone is the long bone that connects your shoulder to your chest wall. A broken collarbone may be treated with a sling or with surgery. Treatment depends on whether the broken ends of the bone are out of place or not. Follow these instructions at home: If you have a sling:  Wear the sling as told by your doctor. Take it off only as told by your doctor.  Loosen the sling if your fingers tingle, become numb, or turn cold and blue.  Do not lift your arm. Keep it across your chest.  Keep the sling clean.  Ask your doctor if you may take off the sling for bathing. ? If your sling is not waterproof, do not let it get wet. Cover the sling with a watertight covering if you take a bath or a shower while wearing it. ? If you may take off your sling when you take a bath or a shower, keep your shoulder in the same position as when the sling is on. Managing pain, stiffness, and swelling   If told, put ice on the injured area: ? If you have a removable sling, take it off as told by your doctor. ? Put ice in a plastic bag. ? Place a towel between your skin and the bag. ? Leave the ice on for 20 minutes, 2-3 times a day. Activity  Avoid activities that make your symptoms worse for 4-6 weeks, or as long as told.  Ask your doctor when it is safe for you to drive.  Do exercises as told by your doctor. General instructions  Do not use any products that contain nicotine or tobacco, such as cigarettes and e-cigarettes. These can delay bone healing. If you need help quitting, ask your doctor.  Take over-the-counter and prescription medicines only as told by your doctor.  Keep all follow-up visits as told by your doctor. This is important. Contact a doctor if:  Your medicine is not  making you feel less pain.  Your medicine is not making swelling better. Get help right away if:  Your cannot feel your arm (your arm is numb).  Your arm is cold.  Your arm is a lighter color than normal. Summary  A clavicle fracture is a broken collarbone. The collarbone is the long bone that connects your shoulder to your chest wall.  Treatment depends on whether the broken ends of the bone are out of place or not.  If you have a sling, wear it as told by your doctor.  Do exercises when your doctor says you can. The exercises will help your arm get strong and move like it used to. This information is not intended to replace advice given to you by your health care provider. Make sure you discuss any questions you have with your health care provider. Document Revised: 11/27/2017 Document Reviewed: 11/03/2016 Elsevier Patient Education  [place].   Rib Fracture  A rib fracture is a break or crack in one of the bones of the ribs. The ribs are like a cage that goes around your upper chest. A broken or cracked rib is often painful, but most do not cause other problems. Most rib fractures usually heal on their own in 1-3 months. Follow these instructions at home: Managing pain, stiffness, and  swelling  If directed, apply ice to the injured area. ? Put ice in a plastic bag. ? Place a towel between your skin and the bag. ? Leave the ice on for 20 minutes, 2-3 times a day.  Take over-the-counter and prescription medicines only as told by your doctor. Activity  Avoid activities that cause pain to the injured area. Protect your injured area.  Slowly increase activity as told by your doctor. General instructions  Do deep breathing as told by your doctor. You may be told to: ? Take deep breaths many times a day. ? Cough many times a day while hugging a pillow. ? Use a device (incentive spirometer) to do deep breathing many times a day.  Drink enough fluid to keep your  pee (urine) clear or pale yellow.  Do not wear a rib belt or binder. These do not allow you to breathe deeply.  Keep all follow-up visits as told by your doctor. This is important. Contact a doctor if:  You have a fever. Get help right away if:  You have trouble breathing.  You are short of breath.  You cannot stop coughing.  You cough up thick or bloody spit (sputum).  You feel sick to your stomach (nauseous), throw up (vomit), or have belly (abdominal) pain.  Your pain gets worse and medicine does not help. Summary  A rib fracture is a break or crack in one of the bones of the ribs.  Apply ice to the injured area and take medicines for pain as told by your doctor.  Take deep breaths and cough many times a day. Hug a pillow every time you cough. This information is not intended to replace advice given to you by your health care provider. Make sure you discuss any questions you have with your health care provider. Document Revised: 11/27/2017 Document Reviewed: 03/17/2017 Elsevier Patient Education  2020 [employer].

## 2020-11-19 NOTE — Progress Notes (Addendum)
Arrived to room 6N04, alert and oriented x4, able to make needs known. No c/o SOB/DOB. 98%on room air. Sling to left arm in place. C/o minimal pain thus far. Oriented to surroundings and call bell. Will continue to monitor.

## 2020-12-06 ENCOUNTER — Other Ambulatory Visit (HOSPITAL_COMMUNITY): Payer: 59

## 2020-12-06 ENCOUNTER — Other Ambulatory Visit: Payer: Self-pay

## 2020-12-06 ENCOUNTER — Encounter (HOSPITAL_BASED_OUTPATIENT_CLINIC_OR_DEPARTMENT_OTHER): Payer: Self-pay | Admitting: Orthopedic Surgery
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
## 2020-12-07 NOTE — Progress Notes (Signed)
Reminder text sent to patient to go for covid testing tomorrow. Address for testing site and hours provided to patient.

## 2020-12-07 NOTE — Progress Notes (Signed)

## 2020-12-08 ENCOUNTER — Other Ambulatory Visit (HOSPITAL_COMMUNITY)
Admission: RE | Admit: 2020-12-08 | Discharge: 2020-12-08 | Disposition: A | Discharge status: Home / Self Care | Payer: 59 | Source: Ambulatory Visit | Attending: Orthopedic Surgery | Admitting: Orthopedic Surgery

## 2020-12-08 DIAGNOSIS — Z01812 Encounter for preprocedural laboratory examination: Secondary | ICD-10-CM | POA: Insufficient documentation

## 2020-12-08 DIAGNOSIS — Z20822 Contact with and (suspected) exposure to covid-19: Secondary | ICD-10-CM | POA: Insufficient documentation

## 2020-12-09 LAB — SARS CORONAVIRUS 2 (TAT 6-24 HRS): SARS Coronavirus 2: NEGATIVE

## 2020-12-10 ENCOUNTER — Ambulatory Visit (HOSPITAL_BASED_OUTPATIENT_CLINIC_OR_DEPARTMENT_OTHER): Payer: 59 | Admitting: Anesthesiology

## 2020-12-10 ENCOUNTER — Ambulatory Visit (HOSPITAL_BASED_OUTPATIENT_CLINIC_OR_DEPARTMENT_OTHER)
Admission: RE | Admit: 2020-12-10 | Discharge: 2020-12-10 | Disposition: A | Discharge status: Home / Self Care | Payer: 59 | Attending: Orthopedic Surgery | Admitting: Orthopedic Surgery

## 2020-12-10 ENCOUNTER — Encounter (HOSPITAL_BASED_OUTPATIENT_CLINIC_OR_DEPARTMENT_OTHER)
Admission: RE | Disposition: A | Discharge status: Home / Self Care | Payer: Self-pay | Source: Home / Self Care | Attending: Orthopedic Surgery

## 2020-12-10 ENCOUNTER — Ambulatory Visit (HOSPITAL_COMMUNITY): Payer: 59

## 2020-12-10 ENCOUNTER — Other Ambulatory Visit: Payer: Self-pay

## 2020-12-10 ENCOUNTER — Encounter (HOSPITAL_BASED_OUTPATIENT_CLINIC_OR_DEPARTMENT_OTHER): Payer: Self-pay | Admitting: Orthopedic Surgery

## 2020-12-10 DIAGNOSIS — W11XXXA Fall on and from ladder, initial encounter: Secondary | ICD-10-CM | POA: Insufficient documentation

## 2020-12-10 DIAGNOSIS — S42002A Fracture of unspecified part of left clavicle, initial encounter for closed fracture: Secondary | ICD-10-CM | POA: Insufficient documentation

## 2020-12-10 DIAGNOSIS — Z79899 Other long term (current) drug therapy: Secondary | ICD-10-CM | POA: Insufficient documentation

## 2020-12-10 DIAGNOSIS — Z882 Allergy status to sulfonamides status: Secondary | ICD-10-CM | POA: Diagnosis not present

## 2020-12-10 DIAGNOSIS — Z4789 Encounter for other orthopedic aftercare: Secondary | ICD-10-CM

## 2020-12-10 HISTORY — PX: ORIF CLAVICULAR FRACTURE: SHX5055

## 2020-12-10 SURGERY — OPEN REDUCTION INTERNAL FIXATION (ORIF) CLAVICULAR FRACTURE
Anesthesia: General | Site: Shoulder | Laterality: Left

## 2020-12-10 MED ORDER — PROPOFOL 10 MG/ML IV BOLUS
INTRAVENOUS | Status: AC
  Filled 2020-12-10: qty 20

## 2020-12-10 MED ORDER — FENTANYL CITRATE (PF) 100 MCG/2ML IJ SOLN
INTRAMUSCULAR | Status: AC
  Filled 2020-12-10: qty 2

## 2020-12-10 MED ORDER — OXYCODONE HCL 5 MG PO TABS
ORAL_TABLET | ORAL | Status: AC
  Filled 2020-12-10: qty 1

## 2020-12-10 MED ORDER — PHENYLEPHRINE HCL (PRESSORS) 10 MG/ML IV SOLN
INTRAVENOUS | Status: DC | PRN
  Administered 2020-12-10: 80 ug via INTRAVENOUS

## 2020-12-10 MED ORDER — EPHEDRINE 5 MG/ML INJ
INTRAVENOUS | Status: AC
  Filled 2020-12-10: qty 10

## 2020-12-10 MED ORDER — LIDOCAINE 2% (20 MG/ML) 5 ML SYRINGE
INTRAMUSCULAR | Status: AC
  Filled 2020-12-10: qty 5

## 2020-12-10 MED ORDER — EPHEDRINE SULFATE 50 MG/ML IJ SOLN
INTRAMUSCULAR | Status: DC | PRN
  Administered 2020-12-10: 10 mg via INTRAVENOUS

## 2020-12-10 MED ORDER — KETOROLAC TROMETHAMINE 30 MG/ML IJ SOLN
30.0000 mg | Freq: Once | INTRAMUSCULAR | Status: AC
  Administered 2020-12-10: 30 mg via INTRAVENOUS

## 2020-12-10 MED ORDER — LACTATED RINGERS IV SOLN: INTRAVENOUS | Status: DC

## 2020-12-10 MED ORDER — MEPERIDINE HCL 25 MG/ML IJ SOLN: 6.2500 mg | INTRAMUSCULAR | Status: DC | PRN

## 2020-12-10 MED ORDER — ROCURONIUM BROMIDE 100 MG/10ML IV SOLN
INTRAVENOUS | Status: DC | PRN
  Administered 2020-12-10: 90 mg via INTRAVENOUS

## 2020-12-10 MED ORDER — ONDANSETRON HCL 4 MG/2ML IJ SOLN
INTRAMUSCULAR | Status: DC | PRN
  Administered 2020-12-10: 4 mg via INTRAVENOUS

## 2020-12-10 MED ORDER — KETOROLAC TROMETHAMINE 30 MG/ML IJ SOLN
INTRAMUSCULAR | Status: AC
  Filled 2020-12-10: qty 1

## 2020-12-10 MED ORDER — MIDAZOLAM HCL 5 MG/5ML IJ SOLN
INTRAMUSCULAR | Status: DC | PRN
  Administered 2020-12-10: 2 mg via INTRAVENOUS

## 2020-12-10 MED ORDER — MIDAZOLAM HCL 2 MG/2ML IJ SOLN
INTRAMUSCULAR | Status: AC
  Filled 2020-12-10: qty 2

## 2020-12-10 MED ORDER — CEFAZOLIN SODIUM-DEXTROSE 2-4 GM/100ML-% IV SOLN
INTRAVENOUS | Status: AC
  Filled 2020-12-10: qty 100

## 2020-12-10 MED ORDER — ROCURONIUM BROMIDE 10 MG/ML (PF) SYRINGE
PREFILLED_SYRINGE | INTRAVENOUS | Status: AC
  Filled 2020-12-10: qty 10

## 2020-12-10 MED ORDER — CEFAZOLIN SODIUM-DEXTROSE 2-4 GM/100ML-% IV SOLN
2.0000 g | INTRAVENOUS | Status: AC
  Administered 2020-12-10: 2 g via INTRAVENOUS

## 2020-12-10 MED ORDER — PROPOFOL 10 MG/ML IV BOLUS
INTRAVENOUS | Status: DC | PRN
  Administered 2020-12-10: 150 mg via INTRAVENOUS

## 2020-12-10 MED ORDER — HYDROMORPHONE HCL 1 MG/ML IJ SOLN
0.2500 mg | INTRAMUSCULAR | Status: DC | PRN
Start: 2020-12-10 — End: 2020-12-10

## 2020-12-10 MED ORDER — METHOCARBAMOL 500 MG PO TABS: 500.0000 mg | ORAL_TABLET | Freq: Four times a day (QID) | ORAL | 1 refills | Status: DC | PRN

## 2020-12-10 MED ORDER — PHENYLEPHRINE 40 MCG/ML (10ML) SYRINGE FOR IV PUSH (FOR BLOOD PRESSURE SUPPORT)
PREFILLED_SYRINGE | INTRAVENOUS | Status: AC
  Filled 2020-12-10: qty 10

## 2020-12-10 MED ORDER — SUGAMMADEX SODIUM 500 MG/5ML IV SOLN
INTRAVENOUS | Status: AC
  Filled 2020-12-10: qty 5

## 2020-12-10 MED ORDER — KETOROLAC TROMETHAMINE 30 MG/ML IJ SOLN
INTRAMUSCULAR | Status: DC | PRN
  Administered 2020-12-10: 30 mg via INTRAVENOUS

## 2020-12-10 MED ORDER — BUPIVACAINE-EPINEPHRINE 0.5% -1:200000 IJ SOLN
INTRAMUSCULAR | Status: DC | PRN
  Administered 2020-12-10: 16 mL

## 2020-12-10 MED ORDER — LIDOCAINE HCL (CARDIAC) PF 100 MG/5ML IV SOSY
PREFILLED_SYRINGE | INTRAVENOUS | Status: DC | PRN
  Administered 2020-12-10: 100 mg via INTRAVENOUS

## 2020-12-10 MED ORDER — OXYCODONE HCL 5 MG PO TABS
5.0000 mg | ORAL_TABLET | Freq: Once | ORAL | Status: AC
Start: 2020-12-10 — End: 2020-12-10
  Administered 2020-12-10: 5 mg via ORAL

## 2020-12-10 MED ORDER — ONDANSETRON HCL 4 MG/2ML IJ SOLN: 4.0000 mg | Freq: Once | INTRAMUSCULAR | Status: DC | PRN

## 2020-12-10 MED ORDER — ONDANSETRON HCL 4 MG PO TABS: 4.0000 mg | ORAL_TABLET | Freq: Three times a day (TID) | ORAL | 1 refills | Status: AC | PRN

## 2020-12-10 MED ORDER — OXYCODONE HCL 5 MG PO TABS: 5.0000 mg | ORAL_TABLET | ORAL | 0 refills | Status: DC | PRN

## 2020-12-10 MED ORDER — FENTANYL CITRATE (PF) 100 MCG/2ML IJ SOLN
INTRAMUSCULAR | Status: DC | PRN
  Administered 2020-12-10: 50 ug via INTRAVENOUS
  Administered 2020-12-10: 100 ug via INTRAVENOUS
  Administered 2020-12-10 (×2): 25 ug via INTRAVENOUS

## 2020-12-10 MED ORDER — DEXAMETHASONE SODIUM PHOSPHATE 10 MG/ML IJ SOLN
INTRAMUSCULAR | Status: AC
  Filled 2020-12-10: qty 1

## 2020-12-10 MED ORDER — ONDANSETRON HCL 4 MG/2ML IJ SOLN
INTRAMUSCULAR | Status: AC
  Filled 2020-12-10: qty 2

## 2020-12-10 MED ORDER — SUGAMMADEX SODIUM 500 MG/5ML IV SOLN
INTRAVENOUS | Status: DC | PRN
  Administered 2020-12-10: 250 mg via INTRAVENOUS

## 2020-12-10 SURGICAL SUPPLY — 54 items
APL SKNCLS STERI-STRIP NONHPOA (GAUZE/BANDAGES/DRESSINGS)
BENZOIN TINCTURE PRP APPL 2/3 (GAUZE/BANDAGES/DRESSINGS) IMPLANT
BIT DRILL 3.5X5.5 QC CALB (BIT) ×2 IMPLANT
BIT DRILL CLAV ALPS 2.7X145 (BIT) ×2 IMPLANT
BIT DRILL SHORT ALPS 2.2 (BIT) ×2 IMPLANT
BLADE CLIPPER SURG (BLADE) IMPLANT
BLADE SURG 15 STRL LF DISP TIS (BLADE) ×1 IMPLANT
BLADE SURG 15 STRL SS (BLADE) ×2
COVER WAND RF STERILE (DRAPES) IMPLANT
DECANTER SPIKE VIAL GLASS SM (MISCELLANEOUS) IMPLANT
DRAPE INCISE IOBAN 66X45 STRL (DRAPES) ×2 IMPLANT
DRAPE U-SHAPE 47X51 STRL (DRAPES) ×2 IMPLANT
DRAPE U-SHAPE 76X120 STRL (DRAPES) ×4 IMPLANT
DRSG EMULSION OIL 3X3 NADH (GAUZE/BANDAGES/DRESSINGS) ×2 IMPLANT
DURAPREP 26ML APPLICATOR (WOUND CARE) ×2 IMPLANT
ELECT NEEDLE TIP 2.8 STRL (NEEDLE) ×2 IMPLANT
ELECT REM PT RETURN 9FT ADLT (ELECTROSURGICAL) ×2
ELECTRODE REM PT RTRN 9FT ADLT (ELECTROSURGICAL) ×1 IMPLANT
GAUZE SPONGE 4X4 12PLY STRL (GAUZE/BANDAGES/DRESSINGS) ×2 IMPLANT
GLOVE BIO SURGEON STRL SZ 6.5 (GLOVE) ×4 IMPLANT
GLOVE BIOGEL PI IND STRL 7.5 (GLOVE) ×1 IMPLANT
GLOVE BIOGEL PI IND STRL 8.5 (GLOVE) ×1 IMPLANT
GLOVE BIOGEL PI INDICATOR 7.5 (GLOVE) ×1
GLOVE BIOGEL PI INDICATOR 8.5 (GLOVE) ×1
GLOVE ORTHO TXT STRL SZ7.5 (GLOVE) ×2 IMPLANT
GLOVE SURG ORTHO 8.5 STRL (GLOVE) ×2 IMPLANT
GOWN STRL REUS W/ TWL LRG LVL3 (GOWN DISPOSABLE) ×1 IMPLANT
GOWN STRL REUS W/TWL LRG LVL3 (GOWN DISPOSABLE) ×2
NS IRRIG 1000ML POUR BTL (IV SOLUTION) ×2 IMPLANT
PACK ARTHROSCOPY DSU (CUSTOM PROCEDURE TRAY) ×2 IMPLANT
PACK BASIN DAY SURGERY FS (CUSTOM PROCEDURE TRAY) ×2 IMPLANT
PENCIL SMOKE EVACUATOR (MISCELLANEOUS) ×2 IMPLANT
PLATE CLAV SUP 125 12H LT (Plate) ×2 IMPLANT
SCREW  LP NL 2.7X16MM (Screw) ×2 IMPLANT
SCREW CORT LP T15 3.5X16 (Screw) ×2 IMPLANT
SCREW LOCK CORT STAR 3.5X16 (Screw) ×4 IMPLANT
SCREW LOCK CORT STAR 3.5X18 (Screw) ×2 IMPLANT
SCREW LOCK CORT STAR 3.5X20 (Screw) ×4 IMPLANT
SCREW LP NL 2.7X16MM (Screw) ×1 IMPLANT
SCREW TIS LP 3.5X18 NS (Screw) ×4 IMPLANT
SLING ARM FOAM STRAP LRG (SOFTGOODS) IMPLANT
SPONGE LAP 4X18 RFD (DISPOSABLE) ×4 IMPLANT
STAPLER VISISTAT 35W (STAPLE) IMPLANT
STRIP CLOSURE SKIN 1/2X4 (GAUZE/BANDAGES/DRESSINGS) IMPLANT
SUCTION FRAZIER HANDLE 10FR (MISCELLANEOUS) ×2
SUCTION TUBE FRAZIER 10FR DISP (MISCELLANEOUS) ×1 IMPLANT
SUT MNCRL AB 4-0 PS2 18 (SUTURE) IMPLANT
SUT VIC AB 0 CT1 18XCR BRD 8 (SUTURE) IMPLANT
SUT VIC AB 0 CT1 8-18 (SUTURE)
SUT VIC AB 2-0 CT1 27 (SUTURE)
SUT VIC AB 2-0 CT1 TAPERPNT 27 (SUTURE) IMPLANT
SYR BULB EAR ULCER 3OZ GRN STR (SYRINGE) ×2 IMPLANT
TOWEL GREEN STERILE FF (TOWEL DISPOSABLE) ×2 IMPLANT
YANKAUER SUCT BULB TIP NO VENT (SUCTIONS) ×2 IMPLANT

## 2020-12-10 NOTE — Discharge Instructions (Signed)
Ice to the shoulder constantly.  Keep the incision covered and clean and dry for one week, then ok to get it wet in the shower.  DO NOT reach behind your back or push up out of a chair with the operative arm. Keep the sling on while up and for sleep  Use a sling while you are up and around for comfort, may remove while seated.  Keep pillow propped behind the operative elbow.  TAKE IT EASY!!  Follow up with Dr Veverly Fells in two weeks in the office, call [phone] for appt    Next dose of Ibuprofen can be taken at 10pm if needed today.   Post Anesthesia Home Care Instructions  Activity: Get plenty of rest for the remainder of the day. A responsible individual must stay with you for 24 hours following the procedure.  For the next 24 hours, DO NOT: -Drive a car -Paediatric nurse -Drink alcoholic beverages -Take any medication unless instructed by your physician -Make any legal decisions or sign important papers.  Meals: Start with liquid foods such as gelatin or soup. Progress to regular foods as tolerated. Avoid greasy, spicy, heavy foods. If nausea and/or vomiting occur, drink only clear liquids until the nausea and/or vomiting subsides. Call your physician if vomiting continues.  Special Instructions/Symptoms: Your throat may feel dry or sore from the anesthesia or the breathing tube placed in your throat during surgery. If this causes discomfort, gargle with warm salt water. The discomfort should disappear within 24 hours.  If you had a scopolamine patch placed behind your ear for the management of post- operative nausea and/or vomiting:  1. The medication in the patch is effective for 72 hours, after which it should be removed.  Wrap patch in a tissue and discard in the trash. Wash hands thoroughly with soap and water. 2. You may remove the patch earlier than 72 hours if you experience unpleasant side effects which may include dry mouth, dizziness or visual disturbances. 3. Avoid  touching the patch. Wash your hands with soap and water after contact with the patch.

## 2020-12-10 NOTE — Anesthesia Preprocedure Evaluation (Addendum)
Anesthesia Evaluation  Patient identified by MRN, date of birth, ID band Patient awake    Reviewed: Allergy & Precautions, NPO status , Patient's Chart, lab work & pertinent test results  Airway Mallampati: I  TM Distance: >3 FB Neck ROM: Full    Dental   Pulmonary    Pulmonary exam normal        Cardiovascular Normal cardiovascular exam     Neuro/Psych    GI/Hepatic GERD  Medicated and Controlled,  Endo/Other    Renal/GU      Musculoskeletal   Abdominal   Peds  Hematology   Anesthesia Other Findings   Reproductive/Obstetrics                             Anesthesia Physical Anesthesia Plan  ASA: II  Anesthesia Plan: General   Post-op Pain Management:    Induction: Intravenous  PONV Risk Score and Plan: 2 and Ondansetron and Midazolam  Airway Management Planned: Oral ETT  Additional Equipment:   Intra-op Plan:   Post-operative Plan: Extubation in OR  Informed Consent: I have reviewed the patients History and Physical, chart, labs and discussed the procedure including the risks, benefits and alternatives for the proposed anesthesia with the patient or authorized representative who has indicated his/her understanding and acceptance.       Plan Discussed with: CRNA and Surgeon  Anesthesia Plan Comments:         Anesthesia Quick Evaluation  

## 2020-12-10 NOTE — Op Note (Signed)
Joel Burke, Joel Burke MEDICAL RECORD [id number] ACCOUNT [id number] DATE OF BIRTH:[birth year] FACILITY: [facility] LOCATION: [facility] PHYSICIAN:STEVEN Orlena Sheldon, MD  OPERATIVE REPORT  DATE OF PROCEDURE:  12/10/2020  PREOPERATIVE DIAGNOSIS:  Left displaced and comminuted clavicle fracture.  POSTOPERATIVE DIAGNOSIS:  Left displaced and comminuted clavicle fracture.  PROCEDURE PERFORMED:  Open reduction internal fixation of left displaced and comminuted clavicle fracture using Biomet locking clavicle plate.  ATTENDING SURGEON:  Esmond Plants, MD  ASSISTANT:  Darol Destine, [place], who was scrubbed during the entire procedure and necessary for satisfactory completion of surgery.  ANESTHESIA:  General anesthesia was used plus local.  ESTIMATED BLOOD LOSS:  Less than 100 mL.  FLUID REPLACEMENT:  1500 mL crystalloid.  INSTRUMENT COUNTS:  Correct.  COMPLICATIONS:  No complications.  ANTIBIOTICS:  Perioperative antibiotics were given.  INDICATIONS:  The patient is a 59 year old male who fell off a ladder injuring his left clavicle and his left chest.  He had 7 rib fractures plus a displaced, shortened and a comminuted clavicle fracture.  The patient continues to complain of severe pain  in the shoulder and clavicle area.  X-rays demonstrate greater than 200% displacement of his clavicle fracture.  We counseled the patient regarding options for treatment, recommending surgical management to ensure alignment and reduction in length and  stabilized the clavicle.  Risks including but not limited to infection, nerve damage, and bleeding was discussed with the patient.  Informed consent obtained.  DESCRIPTION OF PROCEDURE:  After an adequate level of anesthesia was achieved, the patient was positioned in modified beach chair position.  Left shoulder correctly identified and sterilely prepped and draped in the usual manner.  Time-out called,  verifying correct patient, correct site and C-arm  draped into the shoulder field.  We used a longitudinal incision over the bone, subcutaneous clavicle.  After timeout was called with a 10 blade scalpel, dissection down through subcutaneous tissues using  Bovie and Metzenbaum scissors.  We did subperiosteal dissection of the medial clavicle fragment.  Rongeur was used to remove some organizing scar tissue in the area of the fracture site.  We noted there to be a comminuted lateral fragment with a  longitudinal split and the fragment was displaced anteriorly.  Again, using a rongeur to get back to nice bone and then once we were able to get the medial and lateral fragments free of all soft tissue, we aligned the fragments properly.  This was an  oblique fracture.  We used a couple crab claw clamps to stabilize the fracture, anatomically reduced.  We felt like this would be best fixed with 2 lag screws anterior to posterior and then a superior neutralization plate.  We were able to use a 3.5 and  a 2.7 lag screw with good compression and good stability.  We then used the longest precontoured clavicle plate from the ALPS system with Biomet and laid that on the superior cortex of the clavicle.  We were able to get 3 screws, 1 nonlocking, 2 locked  laterally and then 4 screws medially, spanning the fracture site.  We did do a little bit of in situ bending to get the plate anatomically aligned with the bone.  We felt like we had a good construct with the 2 compression screws, interfrag compression  screws and then the neutralization plate.  We irrigated thoroughly.  We took the large butterfly piece of bone, put that back into its proper position and then used two cerclage sutures with 0 Vicryl suture to  hold that piece anatomically reduced  anteriorly and inferiorly.  We then repaired the deep tissues and muscular layer with 0 Vicryl followed by 2-0 Vicryl for subcutaneous closure and 4-0 Monocryl for skin.  Steri-Strips applied followed by sterile dressing.  The  patient tolerated surgery  well.  HN/NUANCE  D:12/10/2020 T:12/10/2020 JOB:[id number]

## 2020-12-10 NOTE — Transfer of Care (Signed)
Immediate Anesthesia Transfer of Care Note  Patient: Joel Burke  Procedure(s) Performed: OPEN REDUCTION INTERNAL FIXATION (ORIF) CLAVICULAR FRACTURE (Left Shoulder)  Patient Location: PACU  Anesthesia Type:General  Level of Consciousness: awake, alert  and oriented  Airway & Oxygen Therapy: Patient Spontanous Breathing and Patient connected to face mask oxygen  Post-op Assessment: Report given to RN and Post -op Vital signs reviewed and stable  Post vital signs: Reviewed and stable  Last Vitals:  Vitals Value Taken Time  BP 149/93 12/10/20 1603  Temp 36.5 C 12/10/20 1604  Pulse 76 12/10/20 1607  Resp 19 12/10/20 1607  SpO2 100 % 12/10/20 1607  Vitals shown include unvalidated device data.  Last Pain:  Vitals:   12/10/20 1604  TempSrc:   PainSc: 0-No pain         Complications: No complications documented.

## 2020-12-10 NOTE — Anesthesia Procedure Notes (Signed)
Procedure Name: Intubation Date/Time: 12/10/2020 2:08 PM Performed by: Glory Buff, CRNA Pre-anesthesia Checklist: Patient identified, Emergency Drugs available, Suction available and Patient being monitored Patient Re-evaluated:Patient Re-evaluated prior to induction Oxygen Delivery Method: Circle system utilized Preoxygenation: Pre-oxygenation with 100% oxygen Induction Type: IV induction Ventilation: Mask ventilation without difficulty Laryngoscope Size: Miller and 3 Grade View: Grade II Tube type: Oral Tube size: 7.5 mm Number of attempts: 1 Airway Equipment and Method: Oral airway and Bougie stylet Placement Confirmation: ETT inserted through vocal cords under direct vision,  positive ETCO2 and breath sounds checked- equal and bilateral Secured at: 21 cm Tube secured with: Tape Dental Injury: Teeth and Oropharynx as per pre-operative assessment  Comments: Base of glottic opening seen but unable to pass ETT, bougee stylet inserted through glottic opening and ETT advanced easily.  +ETCO2 and BBS=.

## 2020-12-10 NOTE — Brief Op Note (Signed)
12/10/2020  3:49 PM  PATIENT:  Joel Burke  59 y.o. male  PRE-OPERATIVE DIAGNOSIS:  Left clavicle fracture, displaced and comminuted  POST-OPERATIVE DIAGNOSIS:  Left clavicle fracture, displaced and comminuted  PROCEDURE:  Procedure(s) with comments: OPEN REDUCTION INTERNAL FIXATION (ORIF) CLAVICULAR FRACTURE (Left) - Needs 90 minutes Biomet clavicle locking plate  SURGEON:  Surgeon(s) and Role:    Netta Cedars, MD - Primary  PHYSICIAN ASSISTANT:   ASSISTANTS: [place] Bruns, PA-C   ANESTHESIA:   local and general  EBL:  50 mL   BLOOD ADMINISTERED:none  DRAINS: none   LOCAL MEDICATIONS USED:  MARCAINE     SPECIMEN:  No Specimen  DISPOSITION OF SPECIMEN:  N/A  COUNTS:  YES  TOURNIQUET:  * No tourniquets in log *  DICTATION: .Other Dictation: Dictation Number [id number]  PLAN OF CARE: Discharge to home after PACU  PATIENT DISPOSITION:  PACU - hemodynamically stable.   Delay start of Pharmacological VTE agent (>24hrs) due to surgical blood loss or risk of bleeding: not applicable

## 2020-12-11 ENCOUNTER — Encounter (HOSPITAL_BASED_OUTPATIENT_CLINIC_OR_DEPARTMENT_OTHER): Payer: Self-pay | Admitting: Orthopedic Surgery

## 2020-12-11 NOTE — Anesthesia Postprocedure Evaluation (Signed)
Anesthesia Post Note  Patient: ACEY WOODFIELD  Procedure(s) Performed: OPEN REDUCTION INTERNAL FIXATION (ORIF) CLAVICULAR FRACTURE (Left Shoulder)     Patient location during evaluation: PACU Anesthesia Type: General Level of consciousness: awake and alert Pain management: pain level controlled Vital Signs Assessment: post-procedure vital signs reviewed and stable Respiratory status: spontaneous breathing, nonlabored ventilation and respiratory function stable Cardiovascular status: blood pressure returned to baseline and stable Postop Assessment: no apparent nausea or vomiting Anesthetic complications: no   No complications documented.  Last Vitals:  Vitals:   12/10/20 1630 12/10/20 1642  BP: (!) 156/93 (!) 157/96  Pulse: 71 71  Resp: 16 16  Temp:  37.3 C  SpO2: 97% 97%    Last Pain:  Vitals:   12/10/20 1642  TempSrc: Oral  PainSc: 5    Pain Goal: Patients Stated Pain Goal: 2 (12/10/20 1642)                 Lidia Collum

## 2021-02-12 ENCOUNTER — Other Ambulatory Visit: Payer: Self-pay | Admitting: Physician Assistant

## 2021-02-12 DIAGNOSIS — M25512 Pain in left shoulder: Secondary | ICD-10-CM

## 2021-03-03 ENCOUNTER — Ambulatory Visit
Admission: RE | Admit: 2021-03-03 | Discharge: 2021-03-03 | Disposition: A | Discharge status: Home / Self Care | Payer: 59 | Source: Ambulatory Visit | Attending: Physician Assistant | Admitting: Physician Assistant

## 2021-03-03 ENCOUNTER — Other Ambulatory Visit: Payer: Self-pay

## 2021-03-03 DIAGNOSIS — M25512 Pain in left shoulder: Secondary | ICD-10-CM

## 2021-03-12 DIAGNOSIS — M7502 Adhesive capsulitis of left shoulder: Secondary | ICD-10-CM | POA: Insufficient documentation

## 2021-03-12 DIAGNOSIS — S43432A Superior glenoid labrum lesion of left shoulder, initial encounter: Secondary | ICD-10-CM | POA: Insufficient documentation

## 2021-04-28 DIAGNOSIS — U071 COVID-19: Secondary | ICD-10-CM

## 2021-04-28 HISTORY — DX: COVID-19: U07.1

## 2021-05-21 ENCOUNTER — Encounter: Payer: Self-pay | Admitting: Internal Medicine

## 2021-07-31 ENCOUNTER — Telehealth: Payer: Self-pay | Admitting: *Deleted

## 2021-07-31 NOTE — Telephone Encounter (Signed)
  Joel Burke, here is the note on the pt I was talking to you about   marie    Signed                                          Procedure Name: Intubation Date/Time: 12/10/2020 2:08 PM Performed by: Glory Buff, CRNA Pre-anesthesia Checklist: Patient identified, Emergency Drugs available, Suction available and Patient being monitored Patient Re-evaluated:Patient Re-evaluated prior to induction Oxygen Delivery Method: Circle system utilized Preoxygenation: Pre-oxygenation with 100% oxygen Induction Type: IV induction Ventilation: Mask ventilation without difficulty Laryngoscope Size: Miller and 3 Grade View: Grade II Tube type: Oral Tube size: 7.5 mm Number of attempts: 1 Airway Equipment and Method: Oral airway and Bougie stylet Placement Confirmation: ETT inserted through vocal cords under direct vision,  positive ETCO2 and breath sounds checked- equal and bilateral Secured at: 21 cm Tube secured with: Tape Dental Injury: Teeth and Oropharynx as per pre-operative assessment  Comments: Base of glottic opening seen but unable to pass ETT, bougee stylet inserted through glottic opening and ETT advanced easily.  +ETCO2 and BBS=.

## 2021-08-01 NOTE — Telephone Encounter (Signed)
Dr. Henrene Pastor,  Would you like an OV with this pt or is he ok for a direct to the hospital?   Thanks, Cyril Mourning

## 2021-08-01 NOTE — Telephone Encounter (Signed)
Please contact the patient and let him know that he has been deemed a difficult intubation after review from our anesthesia personnel.  Thus, his procedure needs to be done at the hospital (versus the [facility]) for safety purposes per our protocol.  He can be scheduled during one of my routine hospital blocks (he does not need an office visit).  Make sure that he is not occupying in [place] slot.  Thanks Dr. Henrene Pastor

## 2021-08-01 NOTE — Telephone Encounter (Signed)
Vaughan Basta, Can you schedule this please?  Thanks, [employer]

## 2021-08-06 ENCOUNTER — Other Ambulatory Visit: Payer: Self-pay

## 2021-08-06 DIAGNOSIS — Z1211 Encounter for screening for malignant neoplasm of colon: Secondary | ICD-10-CM

## 2021-08-06 NOTE — Telephone Encounter (Signed)
Procedure date noted.  Attempted to reach pt and explain new date and time, as well as why he is going to be done at hospital.  No answer and mailbox is full.  Will attempt to reach pt later today

## 2021-08-06 NOTE — Telephone Encounter (Signed)
LMOM to call back regarding appt

## 2021-08-06 NOTE — Telephone Encounter (Signed)
Pt scheduled for colon at [facility] 09/05/21 at 10:15am.  Case [id number].

## 2021-08-07 NOTE — Telephone Encounter (Signed)
Notified pt of changes and reasons- pt verbalized understanding- will keep pv for 8-15 at 1 pm

## 2021-08-12 ENCOUNTER — Other Ambulatory Visit: Payer: Self-pay

## 2021-08-12 ENCOUNTER — Ambulatory Visit (AMBULATORY_SURGERY_CENTER): Payer: 59 | Admitting: *Deleted

## 2021-08-12 VITALS — Ht 71.0 in | Wt 188.0 lb

## 2021-08-12 DIAGNOSIS — Z1211 Encounter for screening for malignant neoplasm of colon: Secondary | ICD-10-CM

## 2021-08-12 DIAGNOSIS — K5190 Ulcerative colitis, unspecified, without complications: Secondary | ICD-10-CM

## 2021-08-12 MED ORDER — NA SULFATE-K SULFATE-MG SULF 17.5-3.13-1.6 GM/177ML PO SOLN
1.0000 | Freq: Once | ORAL | 0 refills | Status: AC
Start: 2021-08-12 — End: 2021-08-12

## 2021-08-12 NOTE — Progress Notes (Signed)
No egg or soy allergy known to patient  No issues with past sedation with any surgeries or procedures Patient has a history of difficult intubation- [facility] case 9-8  No FH of Malignant Hyperthermia No diet pills per patient No home 02 use per patient  No blood thinners per patient  Pt denies issues with constipation  No A fib or A flutter  EMMI video to pt or via [place] 19 guidelines implemented in PV today with Pt and RN  Pt is fully vaccinated  for Covid   Due to the COVID-19 pandemic we are asking patients to follow certain guidelines.  Pt aware of COVID protocols and [facility] guidelines   Pt verified name, DOB, address and insurance during PV today. Pt mailed instruction packet to included paper to complete and mail back to [facility] with addressed and stamped envelope, Emmi video, copy of consent form to read and not return, and instructions. Pt encouraged to call with questions or issues.

## 2021-08-26 ENCOUNTER — Encounter: Payer: 59 | Admitting: Internal Medicine

## 2021-08-29 NOTE — Progress Notes (Signed)
Attempted to obtain medical history via telephone, unable to reach at this time. I left a voicemail to return pre surgical testing department's phone call.  

## 2021-09-04 ENCOUNTER — Telehealth: Payer: Self-pay | Admitting: Internal Medicine

## 2021-09-04 ENCOUNTER — Other Ambulatory Visit: Payer: Self-pay

## 2021-09-04 MED ORDER — NA SULFATE-K SULFATE-MG SULF 17.5-3.13-1.6 GM/177ML PO SOLN: 1.0000 | Freq: Once | ORAL | 0 refills | Status: AC

## 2021-09-04 NOTE — Telephone Encounter (Signed)
Resent Suprep to [employer] - called pt- verified [place]

## 2021-09-04 NOTE — Telephone Encounter (Signed)
Patient called states he has not received the Su-prep for his procedure tomorrow checked with [facility] nothing on file please send and notify patient.

## 2021-09-05 ENCOUNTER — Encounter (HOSPITAL_COMMUNITY)
Admission: RE | Disposition: A | Discharge status: Home / Self Care | Payer: Self-pay | Source: Home / Self Care | Attending: Internal Medicine

## 2021-09-05 ENCOUNTER — Ambulatory Visit (HOSPITAL_COMMUNITY): Payer: 59 | Admitting: Certified Registered"

## 2021-09-05 ENCOUNTER — Encounter (HOSPITAL_COMMUNITY): Payer: Self-pay | Admitting: Internal Medicine

## 2021-09-05 ENCOUNTER — Ambulatory Visit (HOSPITAL_COMMUNITY)
Admission: RE | Admit: 2021-09-05 | Discharge: 2021-09-05 | Disposition: A | Discharge status: Home / Self Care | Payer: 59 | Attending: Internal Medicine | Admitting: Internal Medicine

## 2021-09-05 DIAGNOSIS — K635 Polyp of colon: Secondary | ICD-10-CM | POA: Diagnosis not present

## 2021-09-05 DIAGNOSIS — Z1211 Encounter for screening for malignant neoplasm of colon: Secondary | ICD-10-CM

## 2021-09-05 DIAGNOSIS — Z882 Allergy status to sulfonamides status: Secondary | ICD-10-CM | POA: Insufficient documentation

## 2021-09-05 DIAGNOSIS — D120 Benign neoplasm of cecum: Secondary | ICD-10-CM

## 2021-09-05 DIAGNOSIS — K5100 Ulcerative (chronic) pancolitis without complications: Secondary | ICD-10-CM

## 2021-09-05 HISTORY — PX: POLYPECTOMY: SHX5525

## 2021-09-05 HISTORY — PX: COLONOSCOPY WITH PROPOFOL: SHX5780

## 2021-09-05 HISTORY — PX: BIOPSY: SHX5522

## 2021-09-05 SURGERY — COLONOSCOPY WITH PROPOFOL
Anesthesia: Monitor Anesthesia Care

## 2021-09-05 MED ORDER — PROPOFOL 500 MG/50ML IV EMUL
INTRAVENOUS | Status: AC
  Filled 2021-09-05: qty 50

## 2021-09-05 MED ORDER — LACTATED RINGERS IV SOLN
INTRAVENOUS | Status: AC | PRN
  Administered 2021-09-05: 10 mL/h via INTRAVENOUS

## 2021-09-05 MED ORDER — PROPOFOL 1000 MG/100ML IV EMUL
INTRAVENOUS | Status: AC
  Filled 2021-09-05: qty 100

## 2021-09-05 MED ORDER — PROPOFOL 500 MG/50ML IV EMUL
INTRAVENOUS | Status: DC | PRN
  Administered 2021-09-05: 125 ug/kg/min via INTRAVENOUS

## 2021-09-05 MED ORDER — PROPOFOL 10 MG/ML IV BOLUS
INTRAVENOUS | Status: AC
  Filled 2021-09-05: qty 20

## 2021-09-05 MED ORDER — SODIUM CHLORIDE 0.9 % IV SOLN: INTRAVENOUS | Status: DC

## 2021-09-05 MED ORDER — PROPOFOL 10 MG/ML IV BOLUS
INTRAVENOUS | Status: DC | PRN
  Administered 2021-09-05: 30 mg via INTRAVENOUS
  Administered 2021-09-05 (×5): 20 mg via INTRAVENOUS
  Administered 2021-09-05: 30 mg via INTRAVENOUS

## 2021-09-05 MED ORDER — LIDOCAINE 2% (20 MG/ML) 5 ML SYRINGE
INTRAMUSCULAR | Status: DC | PRN
Start: 2021-09-05 — End: 2021-09-05
  Administered 2021-09-05: 60 mg via INTRAVENOUS

## 2021-09-05 SURGICAL SUPPLY — 21 items
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
## 2021-09-05 NOTE — Transfer of Care (Signed)
Immediate Anesthesia Transfer of Care Note  Patient: Joel Burke  Procedure(s) Performed: COLONOSCOPY WITH PROPOFOL POLYPECTOMY BIOPSY  Patient Location: Endoscopy Unit  Anesthesia Type:MAC  Level of Consciousness: awake and patient cooperative  Airway & Oxygen Therapy: Patient Spontanous Breathing and Patient connected to face mask  Post-op Assessment: Report given to RN and Post -op Vital signs reviewed and stable  Post vital signs: Reviewed and stable  Last Vitals:  Vitals Value Taken Time  BP    Temp    Pulse    Resp    SpO2      Last Pain:  Vitals:   09/05/21 0857  TempSrc: Oral         Complications: No notable events documented.

## 2021-09-05 NOTE — Anesthesia Postprocedure Evaluation (Signed)
Anesthesia Post Note  Patient: Joel Burke  Procedure(s) Performed: COLONOSCOPY WITH PROPOFOL POLYPECTOMY BIOPSY     Patient location during evaluation: PACU Anesthesia Type: MAC Level of consciousness: awake and alert and oriented Pain management: pain level controlled Vital Signs Assessment: post-procedure vital signs reviewed and stable Respiratory status: spontaneous breathing, nonlabored ventilation and respiratory function stable Cardiovascular status: stable and blood pressure returned to baseline Postop Assessment: no apparent nausea or vomiting Anesthetic complications: no   No notable events documented.  Last Vitals:  Vitals:   09/05/21 1109 09/05/21 1115  BP: 140/83 133/83  Pulse: 67   Resp: 17 16  Temp: 36.6 C   SpO2: 100% 98%    Last Pain:  Vitals:   09/05/21 1109  TempSrc: Oral  PainSc: 0-No pain                 Loel Betancur A.

## 2021-09-05 NOTE — Discharge Instructions (Signed)
YOU HAD AN ENDOSCOPIC PROCEDURE TODAY: Refer to the procedure report and other information in the discharge instructions given to you for any specific questions about what was found during the examination. If this information does not answer your questions, please call [place] office at [phone] to clarify.  ° °YOU SHOULD EXPECT: Some feelings of bloating in the abdomen. Passage of more gas than usual. Walking can help get rid of the air that was put into your GI tract during the procedure and reduce the bloating. If you had a lower endoscopy (such as a colonoscopy or flexible sigmoidoscopy) you may notice spotting of blood in your stool or on the toilet paper. Some abdominal soreness may be present for a day or two, also. ° °DIET: Your first meal following the procedure should be a light meal and then it is ok to progress to your normal diet. A half-sandwich or bowl of soup is an example of a good first meal. Heavy or fried foods are harder to digest and may make you feel nauseous or bloated. Drink plenty of fluids but you should avoid alcoholic beverages for 24 hours. If you had a esophageal dilation, please see attached instructions for diet.   ° °ACTIVITY: Your care partner should take you home directly after the procedure. You should plan to take it easy, moving slowly for the rest of the day. You can resume normal activity the day after the procedure however YOU SHOULD NOT DRIVE, use power tools, machinery or perform tasks that involve climbing or major physical exertion for 24 hours (because of the sedation medicines used during the test).  ° °SYMPTOMS TO REPORT IMMEDIATELY: °A gastroenterologist can be reached at any hour. Please call [phone]  for any of the following symptoms:  °Following lower endoscopy (colonoscopy, flexible sigmoidoscopy) °Excessive amounts of blood in the stool  °Significant tenderness, worsening of abdominal pains  °Swelling of the abdomen that is new, acute  °Fever of 100° or  higher  °Following upper endoscopy (EGD, EUS, ERCP, esophageal dilation) °Vomiting of blood or coffee ground material  °New, significant abdominal pain  °New, significant chest pain or pain under the shoulder blades  °Painful or persistently difficult swallowing  °New shortness of breath  °Black, tarry-looking or red, bloody stools ° °FOLLOW UP:  °If any biopsies were taken you will be contacted by phone or by letter within the next 1-3 weeks. Call [phone]  if you have not heard about the biopsies in 3 weeks.  °Please also call with any specific questions about appointments or follow up tests. ° °

## 2021-09-05 NOTE — Op Note (Signed)
[facility] Patient Name: Joel Burke Procedure Date: 09/05/2021 MRN: [id number] Attending MD: Docia Chuck. Henrene Pastor , MD Date of Birth: 1961/05/08 CSN: [id number] Age: 60 Admit Type: Outpatient Procedure:                Colonoscopy with cold snare polypectomy x 2; with                            biopsies Indications:              High risk colon cancer surveillance: Ulcerative                            pancolitis of 8 (or more) years duration. Initial                            diagnosis elsewhere 1991. Subsequent colonoscopic                            examinations 2001 and 2011. Last examination with                            evidence of right-sided colitis. Providers:                Docia Chuck. Henrene Pastor, MD, Jeanella Cara, RN,                            Tyrone Apple, Technician, Dellie Catholic Referring MD:              Medicines:                Monitored Anesthesia Care Complications:            No immediate complications. Estimated blood loss:                            None. Estimated Blood Loss:     Estimated blood loss: none. Procedure:                Pre-Anesthesia Assessment:                           - Prior to the procedure, a History and Physical                            was performed, and patient medications and                            allergies were reviewed. The patient's tolerance of                            previous anesthesia was also reviewed. The risks                            and benefits of the procedure and the sedation                            options and risks  were discussed with the patient.                            All questions were answered, and informed consent                            was obtained. Prior Anticoagulants: The patient has                            taken no previous anticoagulant or antiplatelet                            agents. ASA Grade Assessment: II - A patient with                            mild  systemic disease. After reviewing the risks                            and benefits, the patient was deemed in                            satisfactory condition to undergo the procedure.                           After obtaining informed consent, the colonoscope                            was passed under direct vision. Throughout the                            procedure, the patient's blood pressure, pulse, and                            oxygen saturations were monitored continuously. The                            [id number]) Olympus colonoscope was                            introduced through the anus and advanced to the the                            cecum, identified by appendiceal orifice and                            ileocecal valve. The ileocecal valve, appendiceal                            orifice, and rectum were photographed. The quality                            of the bowel preparation was excellent. The  colonoscopy was performed without difficulty. The                            patient tolerated the procedure well. The bowel                            preparation used was SUPREP via split dose                            instruction. Scope In: 10:39:43 AM Scope Out: 11:00:36 AM Scope Withdrawal Time: 0 hours 18 minutes 48 seconds  Total Procedure Duration: 0 hours 20 minutes 53 seconds  Findings:      Two sessile polyps were found in the cecum and appendiceal orifice. The       polyps were 3 to 5 mm in size. These polyps were removed with a cold       snare. Resection and retrieval were complete.      The rectum had subtle granular changes without significant inflammation.       The colonic mucosa proximal to this area appeared unremarkable.       Surveillance biopsies were taken with a cold forceps for histology       (four-quadrant biopsies with large forceps every 10 cm). N = 32. Impression:               - Two 3 to 5 mm polyps in  the cecum and at the                            appendiceal orifice, removed with a cold snare.                            Resected and retrieved.                           - The entire examined colon is normal on direct and                            retroflexion views save subtle granular changes of                            the rectum. Moderate Sedation:      none Recommendation:           - Repeat colonoscopy in 3 - 5 years for                            surveillance, based on final pathology.                           - Patient has a contact number available for                            emergencies. The signs and symptoms of potential                            delayed complications were discussed with the  patient. Return to normal activities tomorrow.                            Written discharge instructions were provided to the                            patient.                           - Resume previous diet.                           - Continue present medications.                           - Await pathology results.                           - Routine GI office follow-up 1 year Procedure Code(s):        --- Professional ---                           [phone], Colonoscopy, flexible; with removal of                            tumor(s), polyp(s), or other lesion(s) by snare                            technique                           [id number], 38, Colonoscopy, flexible; with biopsy,                            single or multiple Diagnosis Code(s):        --- Professional ---                           K51.00, Ulcerative (chronic) pancolitis without                            complications                           K63.5, Polyp of colon CPT copyright 2019 American Medical Association. All rights reserved. The codes documented in this report are preliminary and upon coder review may  be revised to meet current compliance requirements. Docia Chuck. Henrene Pastor,  MD 09/05/2021 11:22:04 AM This report has been signed electronically. Number of Addenda: 0

## 2021-09-05 NOTE — Anesthesia Preprocedure Evaluation (Signed)
Anesthesia Evaluation  Patient identified by MRN, date of birth, ID band Patient awake    Reviewed: Allergy & Precautions, NPO status , Patient's Chart, lab work & pertinent test results, reviewed documented beta blocker date and time   Airway Mallampati: II  TM Distance: >3 FB Neck ROM: Full    Dental  (+) Missing, Dental Advisory Given,    Pulmonary  [place] ?undiagnosed OSA Hx/o Covid 19 04/2021   Pulmonary exam normal breath sounds clear to auscultation       Cardiovascular negative cardio ROS Normal cardiovascular exam Rhythm:Regular Rate:Normal  No Hx/o HTN although BP elevated this am   Neuro/Psych negative neurological ROS  negative psych ROS   GI/Hepatic Neg liver ROS, GERD  Medicated and Controlled,Hx/o ulcerative colitis   Endo/Other  Hyperlipidemia  Renal/GU Hx/o renal calculi  negative genitourinary   Musculoskeletal negative musculoskeletal ROS (+)   Abdominal   Peds  Hematology negative hematology ROS (+)   Anesthesia Other Findings   Reproductive/Obstetrics ED                             Anesthesia Physical Anesthesia Plan  ASA: 2  Anesthesia Plan: MAC   Post-op Pain Management:    Induction:   PONV Risk Score and Plan: 1 and Propofol infusion and Treatment may vary due to age or medical condition  Airway Management Planned: Natural Airway and Simple Face Mask  Additional Equipment:   Intra-op Plan:   Post-operative Plan:   Informed Consent: I have reviewed the patients History and Physical, chart, labs and discussed the procedure including the risks, benefits and alternatives for the proposed anesthesia with the patient or authorized representative who has indicated his/her understanding and acceptance.     Dental advisory given  Plan Discussed with: CRNA and Anesthesiologist  Anesthesia Plan Comments:         Anesthesia Quick Evaluation

## 2021-09-06 ENCOUNTER — Encounter (HOSPITAL_COMMUNITY): Payer: Self-pay | Admitting: Internal Medicine

## 2021-09-06 LAB — SURGICAL PATHOLOGY

## 2021-11-29 DIAGNOSIS — C61 Malignant neoplasm of prostate: Secondary | ICD-10-CM

## 2021-11-29 HISTORY — DX: Malignant neoplasm of prostate: C61

## 2021-12-28 IMAGING — MR MR SHOULDER*L* W/O CM
4 of 5 series · 20 of 40 positions shown · non-contrast
Comparison: Chest radiographs 11/19/2020. Intraoperative
radiographs of left clavicle 12/10/2020.

CLINICAL DATA: Left shoulder pain since falling off of a ladder
three months ago. Rib and left clavicle fractures sustained at that
time. Shoulder weakness and limited range of motion.

EXAM:
MRI OF THE LEFT SHOULDER WITHOUT CONTRAST
TECHNIQUE: Multiplanar, multisequence MR imaging of the shoulder was performed.
No intravenous contrast was administered.

[Series 6: T2 fat-sat · axial · left · 3.0mm · 0.47mm/px · z∈[-110,-13]mm · 7 of 31 slices shown (1 of 3)]
[im 1/31]
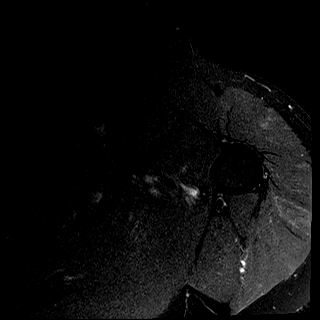
[im 4/31]
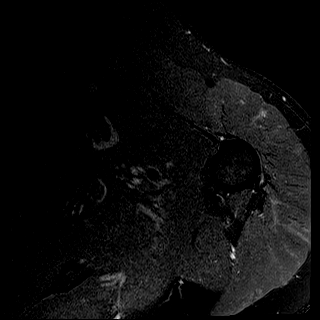
[im 11/31]
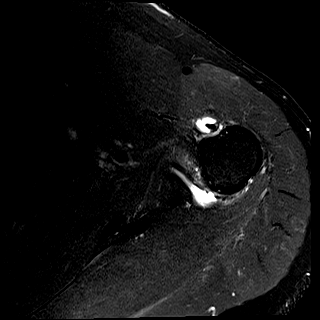
[im 14/31]
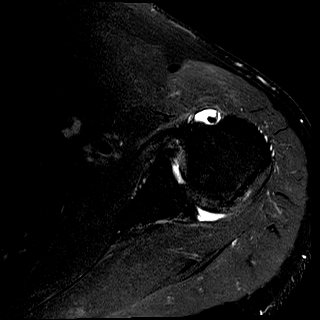
[im 17/31]
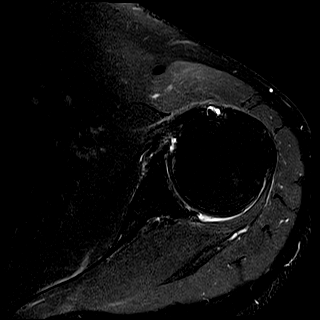
[im 21/31]
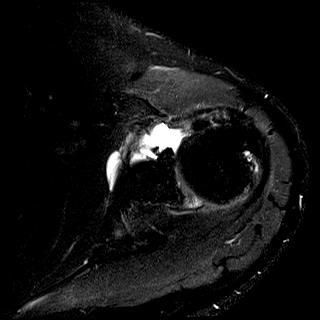
[im 27/31]
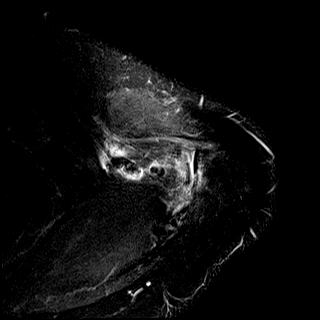

[Series 7: T2 fat-sat · oblique · left · 4.0mm · 0.22mm/px · 3 of 21 slices shown (2 of 3)]
[im 4/21]
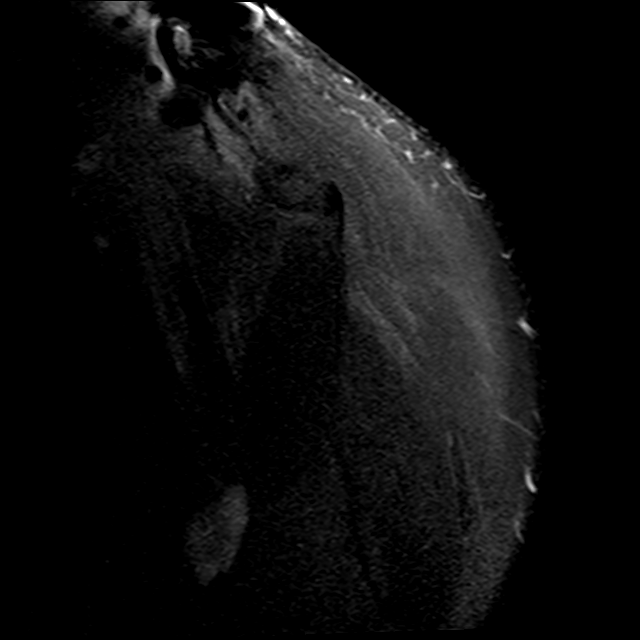
[im 11/21]
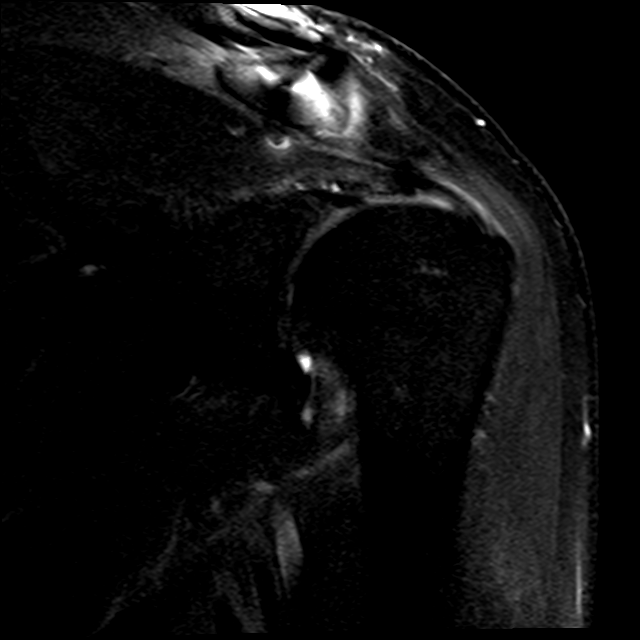
[im 17/21]
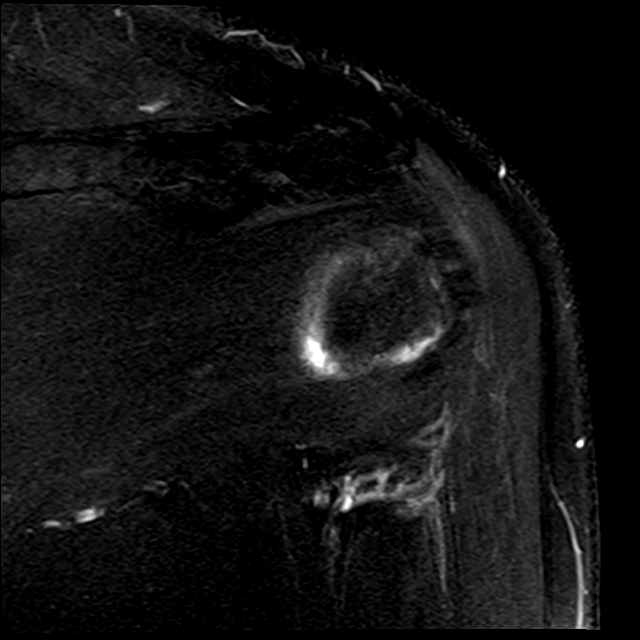

[Series 8: PD · oblique · left · 4.0mm · 0.22mm/px · 7 of 21 slices shown]
[im 1/21]
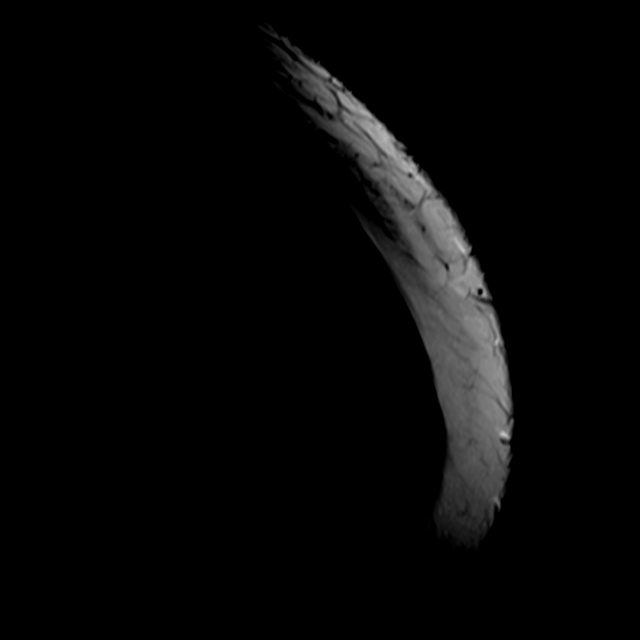
[im 4/21]
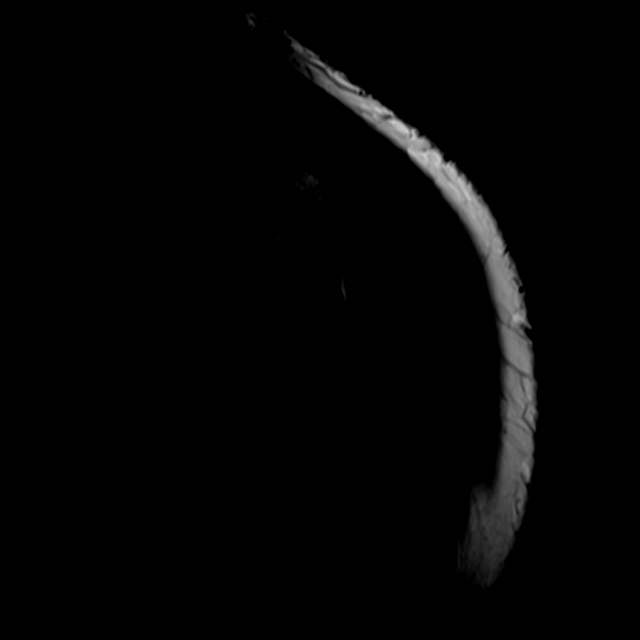
[im 7/21]
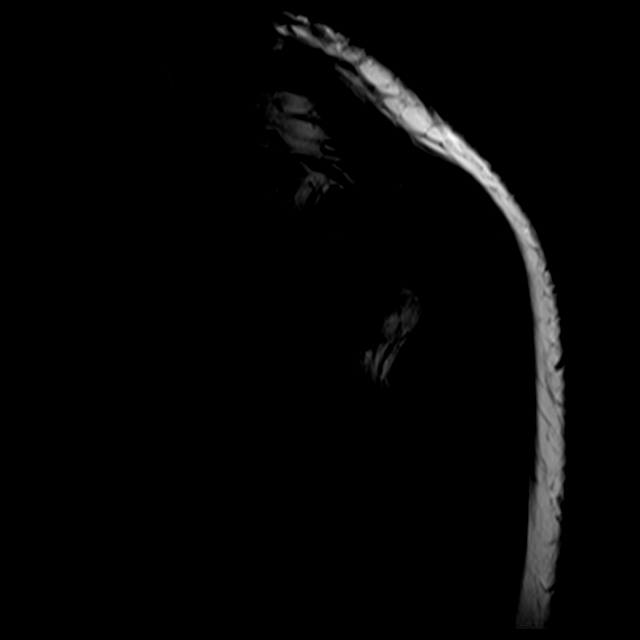
[im 11/21]
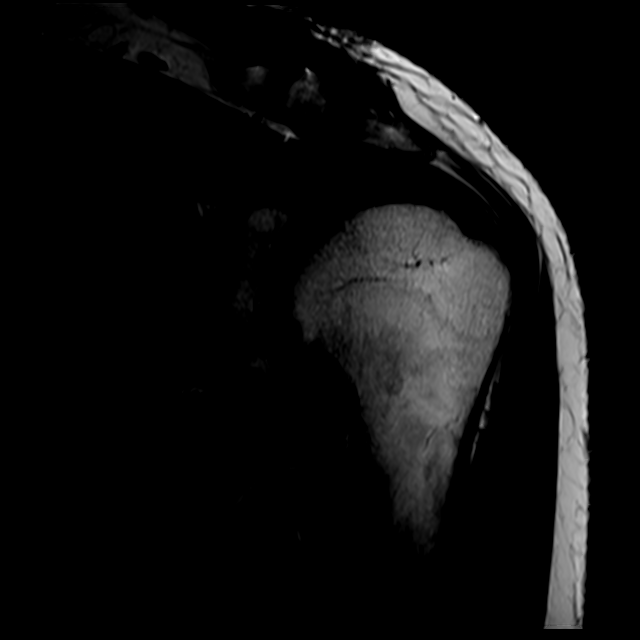
[im 14/21]
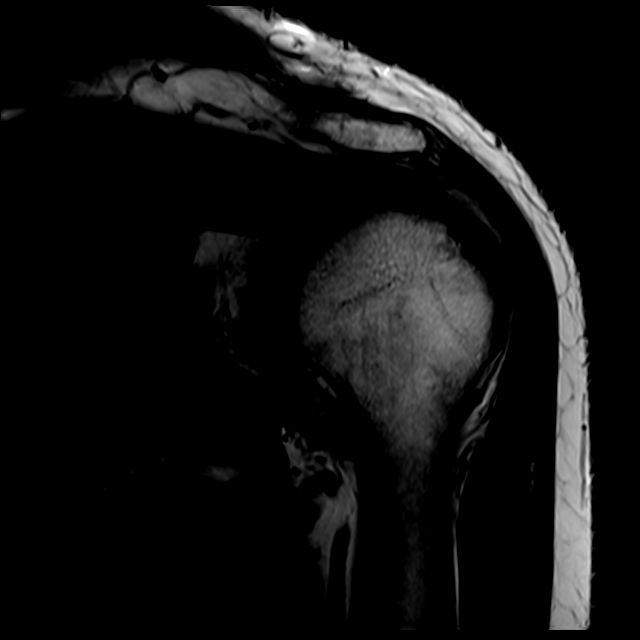
[im 17/21]
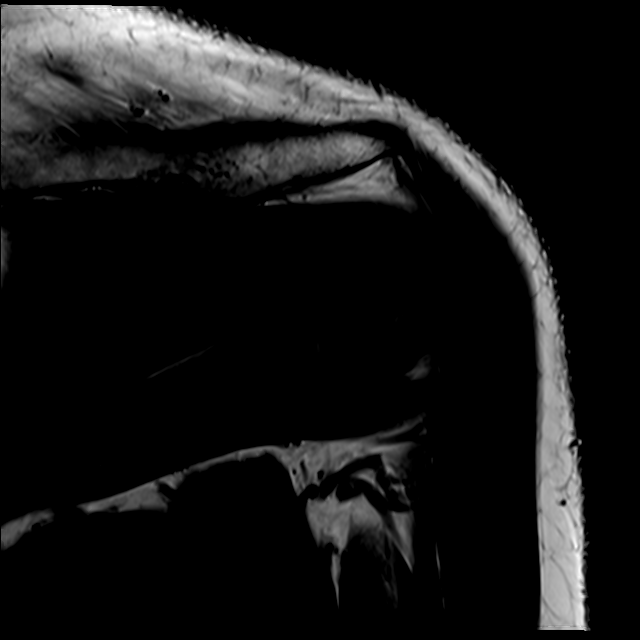
[im 21/21]
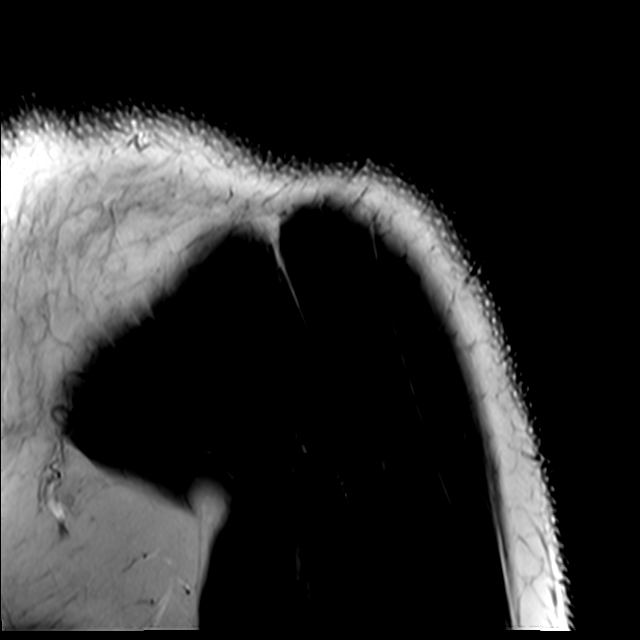

[Series 9: T2 fat-sat · oblique · left · 4.0mm · 0.44mm/px · 3 of 23 slices shown (3 of 3)]
[im 4/23]
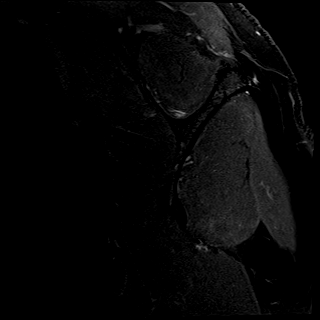
[im 13/23]
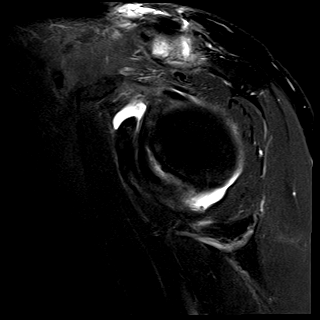
[im 19/23]
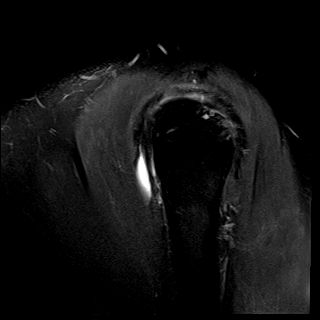

[20 of 40 positions shown; findings below may reference images not displayed]

FINDINGS: Rotator cuff: Mild supraspinatus and infraspinatus tendinosis
without evidence of tear. The subscapularis and teres minor tendons
appear normal.

Muscles:  No focal muscular atrophy or edema.

Biceps long head:  Intact and normally positioned.

Acromioclavicular Joint: The acromion is type 1. There are mild
acromioclavicular degenerative changes. There are postsurgical
changes related to plate and screw fixation of the previously
demonstrated clavicle fracture. The coracoclavicular ligaments
appear grossly intact.

Glenohumeral Joint: Mild glenohumeral degenerative changes. There is
a small shoulder joint effusion with mild capsular thickening in the
axillary recess.

Labrum:  No evidence of labral tear or paralabral cyst.

Bones: Postsurgical changes within the distal clavicle as described
above. No evidence of acute fracture or dislocation. There is mild
cyst formation posteriorly in the humeral head near the
infraspinatus insertion.

Other: No significant soft tissue findings.
IMPRESSION: 1. Postsurgical changes related to plate and screw fixation of the
previously demonstrated clavicle fracture. No acute osseous
findings.
2. Mild supraspinatus and infraspinatus tendinosis. No evidence of
rotator cuff tear.
3. Mild glenohumeral degenerative changes with small joint effusion.
Mild capsular thickening in the axillary recess as can be seen with
adhesive capsulitis.
4. No evidence of labral or biceps tendon tear.
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
## 2022-02-11 ENCOUNTER — Encounter: Payer: Self-pay | Admitting: Urology

## 2022-02-11 ENCOUNTER — Ambulatory Visit (INDEPENDENT_AMBULATORY_CARE_PROVIDER_SITE_OTHER): Payer: Self-pay | Admitting: Urology

## 2022-02-11 ENCOUNTER — Other Ambulatory Visit: Payer: Self-pay

## 2022-02-11 VITALS — BP 144/88 | HR 68 | Wt 180.0 lb

## 2022-02-11 DIAGNOSIS — C61 Malignant neoplasm of prostate: Secondary | ICD-10-CM

## 2022-02-12 LAB — URINALYSIS, ROUTINE W REFLEX MICROSCOPIC
Bilirubin, UA: NEGATIVE
Glucose, UA: NEGATIVE
Ketones, UA: NEGATIVE
Leukocytes,UA: NEGATIVE
Nitrite, UA: NEGATIVE
RBC, UA: NEGATIVE
Specific Gravity, UA: >1.03 — ABNORMAL HIGH (ref 1.005–1.030)
Urobilinogen, Ur: 0.2 mg/dL (ref 0.2–1.0)
pH, UA: 6 (ref 5.0–7.5)

## 2022-02-18 ENCOUNTER — Encounter: Payer: Self-pay | Admitting: *Deleted

## 2022-02-20 NOTE — Progress Notes (Signed)
GU Location of Tumor / Histology: Prostate Ca ? ?If Prostate Cancer, Gleason Score is (4 + 3) and PSA is (4.99 as of 08/2021) ? ?Biopsies: ?Dr. Diona Fanti ? ? ? ? ?Past/Anticipated interventions by urology, if any:  ? ?Past/Anticipated interventions by medical oncology, if any:  ? ?Weight changes, if any: No ? ?IPSS:  1 ?SHIM:  25 ? ?Bowel/Bladder complaints, if any:  No bowel and no bladder issues ? ?Nausea/Vomiting, if any: No ? ?Pain issues, if any:  0/10 ? ?SAFETY ISSUES: ?Prior radiation? No ?Pacemaker/ICD? No ?Possible current pregnancy? Male ?Is the patient on methotrexate? No ? ?Current Complaints / other details:  Need more information on treatment options. ?

## 2022-02-28 ENCOUNTER — Ambulatory Visit
Admission: RE | Admit: 2022-02-28 | Discharge: 2022-02-28 | Disposition: A | Discharge status: Home / Self Care | Payer: Self-pay | Source: Ambulatory Visit | Attending: Radiation Oncology | Admitting: Radiation Oncology

## 2022-02-28 ENCOUNTER — Other Ambulatory Visit: Payer: Self-pay

## 2022-02-28 VITALS — Ht 71.0 in | Wt 185.0 lb

## 2022-02-28 DIAGNOSIS — C61 Malignant neoplasm of prostate: Secondary | ICD-10-CM | POA: Insufficient documentation
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
## 2022-03-24 NOTE — Progress Notes (Signed)
Left message requesting call back regarding treatment decision/questions.  ?

## 2022-03-28 ENCOUNTER — Telehealth: Payer: Self-pay

## 2022-03-28 NOTE — Telephone Encounter (Signed)
Left message for call back regarding decision treatment/questions.  ?

## 2022-05-13 ENCOUNTER — Telehealth: Payer: Self-pay

## 2022-05-13 NOTE — Telephone Encounter (Signed)
FYI patient it in the process of releasing his records from [facility].  He wanted to know the name of the surgeon you recommended at his last visit with you.  Please advise ?

## 2022-05-15 NOTE — Telephone Encounter (Signed)
Called pt informing him to call back if he wants to proceed with consult.  Waiting for call back .

## 2022-05-20 ENCOUNTER — Telehealth: Payer: Self-pay

## 2022-05-20 NOTE — Telephone Encounter (Signed)
Opened in error

## 2022-06-04 ENCOUNTER — Encounter: Payer: Self-pay | Admitting: Urology

## 2022-06-04 ENCOUNTER — Ambulatory Visit (INDEPENDENT_AMBULATORY_CARE_PROVIDER_SITE_OTHER): Payer: 59 | Admitting: Urology

## 2022-06-04 VITALS — BP 164/118 | HR 61 | Ht 71.0 in | Wt 185.0 lb

## 2022-06-04 DIAGNOSIS — C61 Malignant neoplasm of prostate: Secondary | ICD-10-CM | POA: Diagnosis not present
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
## 2022-06-26 NOTE — Progress Notes (Signed)
RN left message for patient with direct contact information for any treatment decisions, questions, or concerns.

## 2022-07-08 ENCOUNTER — Other Ambulatory Visit: Payer: Self-pay | Admitting: Internal Medicine

## 2022-07-08 DIAGNOSIS — E785 Hyperlipidemia, unspecified: Secondary | ICD-10-CM

## 2022-07-17 ENCOUNTER — Telehealth: Payer: Self-pay | Admitting: Urology

## 2022-07-17 NOTE — Telephone Encounter (Signed)
Its been 1.5 months since this patient had a surgical consult re: prostatectomy.  Can you touch base with him about his treatment decision?  Please offer him a f/u appt if he has more questions.    I got a fax with his PSA lab results today and it appears that his PSA has risen significantly in the interim.    Hollice Espy, MD

## 2022-07-17 NOTE — Telephone Encounter (Signed)
Tried calling patient. No answer. Did leave a detailed message on machine to return my call. Will try again.

## 2022-07-18 ENCOUNTER — Telehealth: Payer: Self-pay

## 2022-07-18 ENCOUNTER — Other Ambulatory Visit: Payer: Self-pay | Admitting: Urology

## 2022-07-18 DIAGNOSIS — C61 Malignant neoplasm of prostate: Secondary | ICD-10-CM

## 2022-07-18 NOTE — Progress Notes (Signed)
Surgical Physician Order Form [facility] [place]  * Scheduling expectation : Next Available  *Length of Case:   *Clearance needed: no  *Anticoagulation Instructions: N/A  *Aspirin Instructions: Hold Aspirin  *Post-op visit Date/Instructions:  1 week cath removal  *Diagnosis: Prostate Cancer  *Procedure: bilateral pelvic LN dissection; Robotic laparoscopic Prostatectomy (17915)   Additional orders: N/A  -Admit type: Observation  -Anesthesia: General  -VTE Prophylaxis Standing Order SCD's       Other:   -Standing Lab Orders Per Anesthesia    Lab other:  UA/urine culture, CBC, BMP, INR, type and screen  -Standing Test orders EKG/Chest x-ray per Anesthesia       Test other:   - Medications:  Ancef 2gm IV  -Other orders:  N/A

## 2022-07-18 NOTE — Progress Notes (Signed)
[facility] Posting Form   Surgery Date/Time: Date: 08/25/2022  Surgeon: Dr. Hollice Espy, MD  Surgery Location: Day Surgery  Inpt ( No  )   Outpt (No)   Obs ( Yes  )   Diagnosis:  C61 Prostate Cancer  -CPT: 02774, [phone]  Surgery: Robotic Laparoscopic Radical Prostatectomy with Bilateral Pelvic Lymph Node Dissection  Stop Anticoagulations: N/A, HOLD ASA  Cardiac/Medical/Pulmonary Clearance needed: no  *Orders entered into EPIC  Date: 07/18/22   *Case booked in EPIC  Date: 07/18/22  *Notified pt of Surgery: Date: 07/18/22  PRE-OP UA & CX: yes and will obtain CBC, BMP, INR, TYPE AND SCREEN   *Placed into Prior Authorization Work Que Date: 07/18/22   Assistant/laser/rep:No

## 2022-07-18 NOTE — Telephone Encounter (Signed)
I spoke with Joel Burke. We have discussed possible surgery dates and Monday August 28th, 2023 was agreed upon by all parties. Patient given information about surgery date, what to expect pre-operatively and post operatively.  We discussed that a Pre-Admission Testing office will be calling to set up the pre-op visit that will take place prior to surgery, and that these appointments are typically done over the phone with a Pre-Admissions RN.  Informed patient that our office will communicate any additional care to be provided after surgery. Patients questions or concerns were discussed during our call. Advised to call our office should there be any additional information, questions or concerns that arise. Patient verbalized understanding.

## 2022-07-18 NOTE — Telephone Encounter (Signed)
Spoke with pt. Pt. Would like to proceed with Prostatectomy. If you will send me orders I will schedule him.

## 2022-07-21 ENCOUNTER — Encounter: Payer: Self-pay | Admitting: Internal Medicine

## 2022-08-08 ENCOUNTER — Telehealth: Payer: Self-pay

## 2022-08-08 DIAGNOSIS — C61 Malignant neoplasm of prostate: Secondary | ICD-10-CM

## 2022-08-08 NOTE — Telephone Encounter (Signed)
I think that is ill advisable.  My primary concern is this is a pelvic surgery and immobilization through a long plane ride would put him at risk for DVT.  Additionally, is pretty soon after the prostatectomy and if there is a complication, my preference is that he be in and around the geographic area at least within the first 4 weeks of prostatectomy.  Hollice Espy, MD

## 2022-08-08 NOTE — Telephone Encounter (Signed)
Patient called in, Reports that he is scheduled for a Prostatectomy for on 08/28. Would like to know if you think it is okay for him to go to [facility] on 09/15 with his son. States that he will be sitting and resting most of the time.

## 2022-08-08 NOTE — Telephone Encounter (Signed)
Spoke with pt. Pt. Advised of Dr. Cherrie Gauze recommendations and verbalized understanding.

## 2022-08-14 ENCOUNTER — Encounter: Payer: Self-pay | Admitting: Physical Therapy

## 2022-08-14 ENCOUNTER — Ambulatory Visit: Payer: 59 | Attending: Urology | Admitting: Physical Therapy

## 2022-08-14 DIAGNOSIS — R2689 Other abnormalities of gait and mobility: Secondary | ICD-10-CM | POA: Diagnosis present

## 2022-08-14 DIAGNOSIS — C61 Malignant neoplasm of prostate: Secondary | ICD-10-CM | POA: Insufficient documentation

## 2022-08-14 DIAGNOSIS — R278 Other lack of coordination: Secondary | ICD-10-CM | POA: Insufficient documentation

## 2022-08-14 NOTE — Patient Instructions (Addendum)
Daily fluid intake: 12 fl oz  of water daily, 24 fl oz  ice tea , one soda 1-3 x a week, 16 fl oz coffee,   Balance out water to bladder irritant:  Increase water from 12 fl oz of water to 24 floz water, decrease 24 fl oz of tea to 12 fl oz tea, and coffee from 16 to 8 floz  __   Avoid straining pelvic floor, abdominal muscles , spine  Use log rolling technique instead of getting out of bed with your neck or the sit-up     Log rolling into and out of bed   Log rolling into and out of bed If getting out of bed on R side, Bent knees, scoot hips/ shoulder to L  Raise R arm completely overhead, rolling onto armpit  Then lower bent knees to bed to get into complete side lying position  Then drop legs off bed, and push up onto R elbow/forearm, and use L hand to push onto the bed  __   Proper body mechanics with getting out of a chair to decrease strain  on back &pelvic floor   Avoid holding your breath when Getting out of the chair:  Scoot to front part of chair chair Heels behind knees, feet are hip width apart, nose over toes  Inhale like you are smelling roses Exhale to stand   __  Sitting with feet on ground, four points of contact Catch yourself crossing ankles and thighs  __    Lengthen Back rib by R  shoulder   ( winging)    Lie on L  side , pillow between knees and under head  Pull  R arm overhead over mattress, grab the edge of mattress,pull it upward, drawing elbow away from ears  Breathing 10 reps  Brushing arm with 3/4 turn onto pillow behind back  Lying on L  side ,Pillow/ Block between knees     dragging top forearm across ribs below breast rotating 3/4 turn,  rotating  _R_ only this week  and then back to other palm , maintain top palm on body whole top and not lift shoulder
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
## 2022-08-15 ENCOUNTER — Ambulatory Visit: Payer: 59 | Admitting: Physical Therapy

## 2022-08-15 DIAGNOSIS — R2689 Other abnormalities of gait and mobility: Secondary | ICD-10-CM

## 2022-08-15 DIAGNOSIS — R278 Other lack of coordination: Secondary | ICD-10-CM

## 2022-08-15 NOTE — Patient Instructions (Signed)
Deep core level 1-2 ( handout)   One arm wall push up
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
## 2022-08-18 ENCOUNTER — Encounter
Admission: RE | Admit: 2022-08-18 | Discharge: 2022-08-18 | Disposition: A | Discharge status: Home / Self Care | Payer: 59 | Source: Ambulatory Visit | Attending: Urology | Admitting: Urology

## 2022-08-18 ENCOUNTER — Ambulatory Visit: Payer: 59 | Admitting: Physical Therapy

## 2022-08-18 DIAGNOSIS — R278 Other lack of coordination: Secondary | ICD-10-CM

## 2022-08-18 DIAGNOSIS — R2689 Other abnormalities of gait and mobility: Secondary | ICD-10-CM

## 2022-08-18 HISTORY — DX: Fracture of unspecified carpal bone, unspecified wrist, initial encounter for closed fracture: S62.109A

## 2022-08-18 HISTORY — DX: Fracture of orbit, unspecified, initial encounter for closed fracture: S02.85XA
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
## 2022-08-18 NOTE — Patient Instructions (Addendum)
Your procedure is scheduled on: Monday, August 28 Report to the Registration Desk on the 1st floor of the [facility]. To find out your arrival time, please call [phone] between 1PM - 3PM on: Friday, August 25 If your arrival time is 6:00 am, do not arrive prior to that time as the [facility] entrance doors do not open until 6:00 am.  REMEMBER: Instructions that are not followed completely may result in serious medical risk, up to and including death; or upon the discretion of your surgeon and anesthesiologist your surgery may need to be rescheduled.  Do not eat or drink after midnight the night before surgery.  No gum chewing, lozengers or hard candies.  DO NOT TAKE ANY MEDICATIONS THE MORNING OF SURGERY   One week prior to surgery: starting August 21 Stop Anti-inflammatories (NSAIDS) such as Advil, Aleve, Ibuprofen, Motrin, Naproxen, Naprosyn and Aspirin based products such as Excedrin, Goodys Powder, BC Powder. Stop ANY OVER THE COUNTER supplements until after surgery. Stop glucosamine. You may however, continue to take Tylenol if needed for pain up until the day of surgery.  No Alcohol for 24 hours before or after surgery.  No Smoking including e-cigarettes for 24 hours prior to surgery.  No chewable tobacco products for at least 6 hours prior to surgery.  No nicotine patches on the day of surgery.  Do not use any "recreational" drugs for at least a week prior to your surgery.  Please be advised that the combination of cocaine and anesthesia may have negative outcomes, up to and including death. If you test positive for cocaine, your surgery will be cancelled.  On the morning of surgery brush your teeth with toothpaste and water, you may rinse your mouth with mouthwash if you wish. Do not swallow any toothpaste or mouthwash.  Use CHG Soap as directed on instruction sheet.  Do not wear jewelry, make-up, hairpins, clips or nail polish.  Do not wear lotions, powders, or  perfumes.   Do not shave body from the neck down 48 hours prior to surgery just in case you cut yourself which could leave a site for infection.  Also, freshly shaved skin may become irritated if using the CHG soap.  Contact lenses, hearing aids and dentures may not be worn into surgery.  Do not bring valuables to the hospital. [facility] is not responsible for any missing/lost belongings or valuables.   Notify your doctor if there is any change in your medical condition (cold, fever, infection).  Wear comfortable clothing (specific to your surgery type) to the hospital.  After surgery, you can help prevent lung complications by doing breathing exercises.  Take deep breaths and cough every 1-2 hours. Your doctor may order a device called an Incentive Spirometer to help you take deep breaths. When coughing or sneezing, hold a pillow firmly against your incision with both hands. This is called "splinting." Doing this helps protect your incision. It also decreases belly discomfort.  If you are being admitted to the hospital overnight, leave your suitcase in the car. After surgery it may be brought to your room.  If you are being discharged the day of surgery, you will not be allowed to drive home. You will need a responsible adult (18 years or older) to drive you home and stay with you that night.   If you are taking public transportation, you will need to have a responsible adult (18 years or older) with you. Please confirm with your physician that it  is acceptable to use public transportation.   Please call the [facility] at [phone] if you have any questions about these instructions.  Surgery Visitation Policy:  Patients undergoing a surgery or procedure may have two family members or support persons with them as long as the person is not COVID-19 positive or experiencing its symptoms.   Inpatient Visitation:    Visiting hours are 7 a.m. to 8 p.m. Up to  four visitors are allowed at one time in a patient room, including children. The visitors may rotate out with other people during the day. One designated support person (adult) may remain overnight.   Preparing for Surgery with [place] (CHG) Soap    Before surgery, you can play an important role by reducing the number of germs on your skin.  CHG (Chlorhexidine gluconate) soap is an antiseptic cleanser which kills germs and bonds with the skin to continue killing germs even after washing.  Please do not use if you have an allergy to CHG or antibacterial soaps. If your skin becomes reddened/irritated stop using the CHG.  1. Shower the NIGHT BEFORE SURGERY and the MORNING OF SURGERY with CHG soap.  2. If you choose to wash your hair, wash your hair first as usual with your normal shampoo.  3. After shampooing, rinse your hair and body thoroughly to remove the shampoo.  4. Use CHG as you would any other liquid soap. You can apply CHG directly to the skin and wash gently with a scrungie or a clean washcloth.  5. Apply the CHG soap to your body only from the neck down. Do not use on open wounds or open sores. Avoid contact with your eyes, ears, mouth, and genitals (private parts). Wash face and genitals (private parts) with your normal soap.  6. Wash thoroughly, paying special attention to the area where your surgery will be performed.  7. Thoroughly rinse your body with warm water.  8. Do not shower/wash with your normal soap after using and rinsing off the CHG soap.  9. Pat yourself dry with a clean towel.  10. Wear clean pajamas to bed the night before surgery.  12. Place clean sheets on your bed the night of your first shower and do not sleep with pets.  13. Shower again with the CHG soap on the day of surgery prior to arriving at the hospital.  14. Do not apply any deodorants/lotions/powders.  15. Please wear clean clothes to the hospital.

## 2022-08-18 NOTE — Patient Instructions (Addendum)
Transition from standing to floor :  stand to floor transfer :      _ slow     _ mini squat      _ crawl down with one hand on thigh      _downward dog  - >  shoulders down and back-  walk the dog ( knee bents to lengthe hamstrings)      Floor to stand :   downward dog   Feet are wider than hips, crawl hands back, butt is back, knees behind toes -> squat  Hands at waist , elbows back, chest lifts  ___   PELVIC FLOOR / KEGEL EXERCISES   Pelvic floor/ Kegel exercises are used to strengthen the muscles in the base of your pelvis that are responsible for supporting your pelvic organs and preventing urine/feces leakage. Based on your therapist's recommendations, they can be performed while standing, sitting, or lying down.  Make yourself aware of this muscle group by using these cues: Imagine you are in a crowded room and you feel the need to pass gas. Your response is to pull up and in at the rectum. Close the rectum. Pull the muscles up inside your body,feeling your scrotum lifting as well . Feel the pelvic floor muscles lift as if you were walking into a cold lake. Place your hand on top of your pubic bone. Tighten and draw in the muscles around the anal muscles without squeezing the buttock muscles.  Common Errors: Breath holding: If you are holding your breath, you may be bearing down against your bladder instead of pulling it up. If you belly bulges up while you are squeezing, you are holding your breath. Be sure to breathe gently in and out while exercising. Counting out loud may help you avoid holding your breath. Accessory muscle use: You should not see or feel other muscle movement when performing pelvic floor exercises. When done properly, no one can tell that you are performing the exercises. Keep the buttocks, belly and inner thighs relaxed. Overdoing it: Your muscles can fatigue and stop working for you if you over-exercise. You may actually leak more or feel soreness at  the lower abdomen or rectum.  YOUR HOME EXERCISE PROGRAM   SHORT HOLDS: Position: on back Inhale and then exhale. Then squeeze the muscle.  (Be sure to let belly sink in with exhales and not push outward) Perform 4repetitions  before deep core level 1, between deep core level 1-2, after level 2 ( 3 sets)  , do again this sequence with deep core again at night ( total is 6 sets/ day,  2 sets of deep core level 1-2/ day)     **ALSO SQUEEZE BEFORE YOUR SNEEZE, COUGH, LAUGH to decrease downward pressure   **ALSO EXHALE BEFORE YOU RISE AGAINST GRAVITY (lifting, sit to stand, from squat to stand)   __  Before surgery and after  Laying on your back  Morning and night   Mon Tue Wed Thurs Fri  Sat Sun  Winging/ brushing  stretch L / R - 15 reps           quick  Pelvic floor squeeze  4 reps   Deep core level 1 +   quick  Pelvic floor squeeze  4 reps           Deep core level 2  ( knee out)  77mn)   quick  Pelvic floor squeeze  4 reps  After surgery with catheter ( DO NOT DO PELVIC EXERCISES )   Mon Tue Wed Thurs Fri  Sat Sun  open book stretch L / R - 15 reps          Deep core level 1 +  10reps           Deep core level 2  ( knee out)  21mn)                      AFTER Catheter is removed: Before surgery and after  Laying on your back  Morning and night   Mon Tue Wed Thurs Fri  Sat Sun  Winging/ brushing  stretch L / R - 15 reps           quick  Pelvic floor squeeze  4 reps   Deep core level 1 +   quick  Pelvic floor squeeze  4 reps           Deep core level 2  ( knee out)  69m)   quick  Pelvic floor squeeze  4 reps

## 2022-08-19 ENCOUNTER — Encounter
Admission: RE | Admit: 2022-08-19 | Discharge: 2022-08-19 | Disposition: A | Discharge status: Home / Self Care | Payer: 59 | Source: Ambulatory Visit | Attending: Urology | Admitting: Urology

## 2022-08-19 DIAGNOSIS — Z01812 Encounter for preprocedural laboratory examination: Secondary | ICD-10-CM | POA: Diagnosis present

## 2022-08-19 DIAGNOSIS — C61 Malignant neoplasm of prostate: Secondary | ICD-10-CM | POA: Insufficient documentation

## 2022-08-19 LAB — URINALYSIS, COMPLETE (UACMP) WITH MICROSCOPIC
Bacteria, UA: NONE SEEN
Bilirubin Urine: NEGATIVE
Glucose, UA: NEGATIVE mg/dL
Hgb urine dipstick: NEGATIVE
Ketones, ur: NEGATIVE mg/dL
Leukocytes,Ua: NEGATIVE
Nitrite: NEGATIVE
Protein, ur: NEGATIVE mg/dL
Specific Gravity, Urine: 1.024 (ref 1.005–1.030)
Squamous Epithelial / HPF: NONE SEEN (ref 0–5)
pH: 5 (ref 5.0–8.0)

## 2022-08-19 LAB — TYPE AND SCREEN
ABO/RH(D): O POS
Antibody Screen: NEGATIVE

## 2022-08-19 LAB — BASIC METABOLIC PANEL
Anion gap: 5 (ref 5–15)
BUN: 20 mg/dL (ref 6–20)
CO2: 26 mmol/L (ref 22–32)
Calcium: 9.2 mg/dL (ref 8.9–10.3)
Chloride: 110 mmol/L (ref 98–111)
Creatinine, Ser: 1.2 mg/dL (ref 0.61–1.24)
GFR, Estimated: >60 mL/min (ref >60)
Glucose, Bld: 100 mg/dL — ABNORMAL HIGH (ref 70–99)
Potassium: 4.1 mmol/L (ref 3.5–5.1)
Sodium: 141 mmol/L (ref 135–145)

## 2022-08-19 LAB — CBC
HCT: 48.8 % (ref 39.0–52.0)
Hemoglobin: 16.4 g/dL (ref 13.0–17.0)
MCH: 28.9 pg (ref 26.0–34.0)
MCHC: 33.6 g/dL (ref 30.0–36.0)
MCV: 85.9 fL (ref 80.0–100.0)
Platelets: 238 10*3/uL (ref 150–400)
RBC: 5.68 MIL/uL (ref 4.22–5.81)
RDW: 13.4 % (ref 11.5–15.5)
WBC: 7.3 10*3/uL (ref 4.0–10.5)
nRBC: 0 % (ref 0.0–0.2)

## 2022-08-19 LAB — PROTIME-INR
INR: 0.9 (ref 0.8–1.2)
Prothrombin Time: 12.4 seconds (ref 11.4–15.2)

## 2022-08-21 ENCOUNTER — Encounter: Payer: Self-pay | Admitting: Urgent Care

## 2022-08-24 LAB — URINE CULTURE: Culture: NO GROWTH

## 2022-08-25 ENCOUNTER — Other Ambulatory Visit: Payer: Self-pay

## 2022-08-25 ENCOUNTER — Ambulatory Visit: Payer: 59 | Admitting: Urgent Care

## 2022-08-25 ENCOUNTER — Encounter: Payer: Self-pay | Admitting: Urology

## 2022-08-25 ENCOUNTER — Observation Stay
Admission: RE | Admit: 2022-08-25 | Discharge: 2022-08-26 | Disposition: A | Discharge status: Home / Self Care | Payer: 59 | Attending: Urology | Admitting: Urology

## 2022-08-25 ENCOUNTER — Encounter
Admission: RE | Disposition: A | Discharge status: Home / Self Care | Payer: Self-pay | Source: Home / Self Care | Attending: Urology

## 2022-08-25 DIAGNOSIS — Z8616 Personal history of COVID-19: Secondary | ICD-10-CM | POA: Insufficient documentation

## 2022-08-25 DIAGNOSIS — Z8042 Family history of malignant neoplasm of prostate: Secondary | ICD-10-CM | POA: Insufficient documentation

## 2022-08-25 DIAGNOSIS — C61 Malignant neoplasm of prostate: Secondary | ICD-10-CM | POA: Diagnosis not present

## 2022-08-25 HISTORY — PX: ROBOT ASSISTED LAPAROSCOPIC RADICAL PROSTATECTOMY: SHX5141

## 2022-08-25 HISTORY — PX: PELVIC LYMPH NODE DISSECTION: SHX6543

## 2022-08-25 LAB — CBC
HCT: 45.2 % (ref 39.0–52.0)
Hemoglobin: 15 g/dL (ref 13.0–17.0)
MCH: 29 pg (ref 26.0–34.0)
MCHC: 33.2 g/dL (ref 30.0–36.0)
MCV: 87.4 fL (ref 80.0–100.0)
Platelets: 245 10*3/uL (ref 150–400)
RBC: 5.17 MIL/uL (ref 4.22–5.81)
RDW: 13.5 % (ref 11.5–15.5)
WBC: 19.6 10*3/uL — ABNORMAL HIGH (ref 4.0–10.5)
nRBC: 0 % (ref 0.0–0.2)

## 2022-08-25 LAB — ABO/RH: ABO/RH(D): O POS

## 2022-08-25 SURGERY — PROSTATECTOMY, RADICAL, ROBOT-ASSISTED, LAPAROSCOPIC
Anesthesia: General

## 2022-08-25 MED ORDER — DOCUSATE SODIUM 100 MG PO CAPS
100.0000 mg | ORAL_CAPSULE | Freq: Two times a day (BID) | ORAL | Status: DC
  Administered 2022-08-25: 100 mg via ORAL

## 2022-08-25 MED ORDER — MORPHINE SULFATE (PF) 4 MG/ML IV SOLN
INTRAVENOUS | Status: AC
  Filled 2022-08-25: qty 1

## 2022-08-25 MED ORDER — FENTANYL CITRATE (PF) 100 MCG/2ML IJ SOLN
INTRAMUSCULAR | Status: AC
  Administered 2022-08-25: 50 ug via INTRAVENOUS
  Filled 2022-08-25: qty 2

## 2022-08-25 MED ORDER — MORPHINE SULFATE (PF) 4 MG/ML IV SOLN
INTRAVENOUS | Status: AC
  Administered 2022-08-25: 4 mg
  Filled 2022-08-25: qty 1

## 2022-08-25 MED ORDER — OXYCODONE HCL 5 MG/5ML PO SOLN: 5.0000 mg | Freq: Once | ORAL | Status: AC | PRN

## 2022-08-25 MED ORDER — OXYCODONE-ACETAMINOPHEN 5-325 MG PO TABS
ORAL_TABLET | ORAL | Status: AC
  Administered 2022-08-25: 2 via ORAL
  Filled 2022-08-25: qty 2

## 2022-08-25 MED ORDER — DIPHENHYDRAMINE HCL 12.5 MG/5ML PO ELIX: 12.5000 mg | ORAL_SOLUTION | Freq: Four times a day (QID) | ORAL | Status: DC | PRN

## 2022-08-25 MED ORDER — CEFAZOLIN SODIUM-DEXTROSE 1-4 GM/50ML-% IV SOLN
1.0000 g | Freq: Three times a day (TID) | INTRAVENOUS | Status: AC
  Administered 2022-08-26: 1 g via INTRAVENOUS

## 2022-08-25 MED ORDER — DROPERIDOL 2.5 MG/ML IJ SOLN
0.6250 mg | Freq: Once | INTRAMUSCULAR | Status: DC | PRN
Start: 2022-08-25 — End: 2022-08-25

## 2022-08-25 MED ORDER — MIDAZOLAM HCL 2 MG/2ML IJ SOLN
INTRAMUSCULAR | Status: AC
  Filled 2022-08-25: qty 2

## 2022-08-25 MED ORDER — SURGIFLO WITH THROMBIN (HEMOSTATIC MATRIX KIT) OPTIME
TOPICAL | Status: DC | PRN
  Administered 2022-08-25: 1 via TOPICAL

## 2022-08-25 MED ORDER — FENTANYL CITRATE (PF) 100 MCG/2ML IJ SOLN
25.0000 ug | INTRAMUSCULAR | Status: AC | PRN
  Administered 2022-08-25 (×4): 25 ug via INTRAVENOUS

## 2022-08-25 MED ORDER — ONDANSETRON HCL 4 MG/2ML IJ SOLN
INTRAMUSCULAR | Status: AC
  Filled 2022-08-25: qty 2

## 2022-08-25 MED ORDER — CEFAZOLIN SODIUM-DEXTROSE 2-4 GM/100ML-% IV SOLN
INTRAVENOUS | Status: AC
  Filled 2022-08-25: qty 100

## 2022-08-25 MED ORDER — DEXAMETHASONE SODIUM PHOSPHATE 10 MG/ML IJ SOLN
INTRAMUSCULAR | Status: DC | PRN
  Administered 2022-08-25: 10 mg via INTRAVENOUS

## 2022-08-25 MED ORDER — LACTATED RINGERS IV SOLN: INTRAVENOUS | Status: DC

## 2022-08-25 MED ORDER — FAMOTIDINE 20 MG PO TABS: 20.0000 mg | ORAL_TABLET | Freq: Once | ORAL | Status: AC

## 2022-08-25 MED ORDER — CEFAZOLIN SODIUM-DEXTROSE 1-4 GM/50ML-% IV SOLN
INTRAVENOUS | Status: AC
  Administered 2022-08-25: 1 g via INTRAVENOUS
  Filled 2022-08-25: qty 50

## 2022-08-25 MED ORDER — DIPHENHYDRAMINE HCL 50 MG/ML IJ SOLN: 12.5000 mg | Freq: Four times a day (QID) | INTRAMUSCULAR | Status: DC | PRN

## 2022-08-25 MED ORDER — PROMETHAZINE HCL 25 MG/ML IJ SOLN: 6.2500 mg | INTRAMUSCULAR | Status: DC | PRN

## 2022-08-25 MED ORDER — KETAMINE HCL 50 MG/5ML IJ SOSY
PREFILLED_SYRINGE | INTRAMUSCULAR | Status: AC
  Filled 2022-08-25: qty 5

## 2022-08-25 MED ORDER — ACETAMINOPHEN 10 MG/ML IV SOLN
INTRAVENOUS | Status: AC
  Filled 2022-08-25: qty 100

## 2022-08-25 MED ORDER — ONDANSETRON HCL 4 MG/2ML IJ SOLN
INTRAMUSCULAR | Status: DC | PRN
  Administered 2022-08-25: 4 mg via INTRAVENOUS

## 2022-08-25 MED ORDER — ONDANSETRON HCL 4 MG/2ML IJ SOLN
4.0000 mg | INTRAMUSCULAR | Status: DC | PRN
  Administered 2022-08-25: 4 mg via INTRAVENOUS

## 2022-08-25 MED ORDER — ZOLPIDEM TARTRATE 5 MG PO TABS: 5.0000 mg | ORAL_TABLET | Freq: Every evening | ORAL | Status: DC | PRN

## 2022-08-25 MED ORDER — FENTANYL CITRATE (PF) 100 MCG/2ML IJ SOLN
INTRAMUSCULAR | Status: DC | PRN
  Administered 2022-08-25: 100 ug via INTRAVENOUS

## 2022-08-25 MED ORDER — CHLORHEXIDINE GLUCONATE 0.12 % MT SOLN: 15.0000 mL | Freq: Once | OROMUCOSAL | Status: AC

## 2022-08-25 MED ORDER — SODIUM CHLORIDE 0.9 % IV SOLN: INTRAVENOUS | Status: DC

## 2022-08-25 MED ORDER — ACETAMINOPHEN 10 MG/ML IV SOLN
INTRAVENOUS | Status: DC | PRN
  Administered 2022-08-25: 1000 mg via INTRAVENOUS

## 2022-08-25 MED ORDER — STERILE WATER FOR IRRIGATION IR SOLN
Status: DC | PRN
  Administered 2022-08-25: 200 mL

## 2022-08-25 MED ORDER — MIDAZOLAM HCL 2 MG/2ML IJ SOLN
INTRAMUSCULAR | Status: DC | PRN
  Administered 2022-08-25: 2 mg via INTRAVENOUS

## 2022-08-25 MED ORDER — ACETAMINOPHEN 10 MG/ML IV SOLN
1000.0000 mg | Freq: Once | INTRAVENOUS | Status: DC | PRN
Start: 2022-08-25 — End: 2022-08-25

## 2022-08-25 MED ORDER — FAMOTIDINE 20 MG PO TABS
ORAL_TABLET | ORAL | Status: AC
  Administered 2022-08-25: 20 mg via ORAL
  Filled 2022-08-25: qty 1

## 2022-08-25 MED ORDER — SUGAMMADEX SODIUM 200 MG/2ML IV SOLN
INTRAVENOUS | Status: DC | PRN
  Administered 2022-08-25: 400 mg via INTRAVENOUS

## 2022-08-25 MED ORDER — HEPARIN SODIUM (PORCINE) 5000 UNIT/ML IJ SOLN
INTRAMUSCULAR | Status: AC
  Administered 2022-08-25: 5000 [IU] via SUBCUTANEOUS
  Filled 2022-08-25: qty 1

## 2022-08-25 MED ORDER — ACETAMINOPHEN 325 MG PO TABS: 650.0000 mg | ORAL_TABLET | ORAL | Status: DC | PRN

## 2022-08-25 MED ORDER — PHENYLEPHRINE HCL-NACL 20-0.9 MG/250ML-% IV SOLN
INTRAVENOUS | Status: AC
  Filled 2022-08-25: qty 250

## 2022-08-25 MED ORDER — HEPARIN SODIUM (PORCINE) 5000 UNIT/ML IJ SOLN: 5000.0000 [IU] | Freq: Three times a day (TID) | INTRAMUSCULAR | Status: DC

## 2022-08-25 MED ORDER — LIDOCAINE HCL (PF) 2 % IJ SOLN
INTRAMUSCULAR | Status: AC
  Filled 2022-08-25: qty 5

## 2022-08-25 MED ORDER — ROCURONIUM BROMIDE 100 MG/10ML IV SOLN
INTRAVENOUS | Status: DC | PRN
  Administered 2022-08-25: 40 mg via INTRAVENOUS
  Administered 2022-08-25: 80 mg via INTRAVENOUS
  Administered 2022-08-25: 20 mg via INTRAVENOUS

## 2022-08-25 MED ORDER — CHLORHEXIDINE GLUCONATE 0.12 % MT SOLN
OROMUCOSAL | Status: AC
  Administered 2022-08-25: 15 mL
  Filled 2022-08-25: qty 15

## 2022-08-25 MED ORDER — CEFAZOLIN SODIUM-DEXTROSE 2-4 GM/100ML-% IV SOLN
2.0000 g | INTRAVENOUS | Status: AC
  Administered 2022-08-25: 2 g via INTRAVENOUS

## 2022-08-25 MED ORDER — KETAMINE HCL 10 MG/ML IJ SOLN
INTRAMUSCULAR | Status: DC | PRN
  Administered 2022-08-25: 50 mg via INTRAVENOUS

## 2022-08-25 MED ORDER — DEXMEDETOMIDINE HCL IN NACL 200 MCG/50ML IV SOLN
INTRAVENOUS | Status: DC | PRN
  Administered 2022-08-25: 4 ug via INTRAVENOUS
  Administered 2022-08-25 (×2): 8 ug via INTRAVENOUS
  Administered 2022-08-25: 4 ug via INTRAVENOUS

## 2022-08-25 MED ORDER — DEXAMETHASONE SODIUM PHOSPHATE 10 MG/ML IJ SOLN
INTRAMUSCULAR | Status: AC
  Filled 2022-08-25: qty 1

## 2022-08-25 MED ORDER — BUPIVACAINE HCL 0.5 % IJ SOLN
INTRAMUSCULAR | Status: DC | PRN
  Administered 2022-08-25: 10 mL

## 2022-08-25 MED ORDER — OXYCODONE HCL 5 MG PO TABS: 5.0000 mg | ORAL_TABLET | Freq: Once | ORAL | Status: AC | PRN

## 2022-08-25 MED ORDER — ATORVASTATIN CALCIUM 10 MG PO TABS
10.0000 mg | ORAL_TABLET | Freq: Every day | ORAL | Status: DC
  Administered 2022-08-25: 10 mg via ORAL
  Filled 2022-08-25 (×2): qty 1

## 2022-08-25 MED ORDER — FENTANYL CITRATE (PF) 100 MCG/2ML IJ SOLN
INTRAMUSCULAR | Status: AC
  Filled 2022-08-25: qty 2

## 2022-08-25 MED ORDER — LORATADINE 10 MG PO TABS: 10.0000 mg | ORAL_TABLET | Freq: Every day | ORAL | Status: DC

## 2022-08-25 MED ORDER — OXYCODONE-ACETAMINOPHEN 5-325 MG PO TABS
1.0000 | ORAL_TABLET | ORAL | Status: DC | PRN
  Administered 2022-08-26 (×2): 2 via ORAL

## 2022-08-25 MED ORDER — ROCURONIUM BROMIDE 10 MG/ML (PF) SYRINGE
PREFILLED_SYRINGE | INTRAVENOUS | Status: AC
  Filled 2022-08-25: qty 10

## 2022-08-25 MED ORDER — MORPHINE SULFATE (PF) 2 MG/ML IV SOLN
2.0000 mg | INTRAVENOUS | Status: DC | PRN
  Administered 2022-08-25: 4 mg via INTRAVENOUS

## 2022-08-25 MED ORDER — LIDOCAINE HCL (CARDIAC) PF 100 MG/5ML IV SOSY
PREFILLED_SYRINGE | INTRAVENOUS | Status: DC | PRN
  Administered 2022-08-25: 100 mg via INTRAVENOUS

## 2022-08-25 MED ORDER — PROPOFOL 10 MG/ML IV BOLUS
INTRAVENOUS | Status: DC | PRN
  Administered 2022-08-25: 150 mg via INTRAVENOUS

## 2022-08-25 MED ORDER — OXYCODONE HCL 5 MG PO TABS
ORAL_TABLET | ORAL | Status: AC
  Administered 2022-08-25: 5 mg via ORAL
  Filled 2022-08-25: qty 1

## 2022-08-25 MED ORDER — ORAL CARE MOUTH RINSE: 15.0000 mL | Freq: Once | OROMUCOSAL | Status: AC

## 2022-08-25 MED ORDER — BUPIVACAINE HCL (PF) 0.5 % IJ SOLN
INTRAMUSCULAR | Status: AC
  Filled 2022-08-25: qty 30

## 2022-08-25 SURGICAL SUPPLY — 91 items
AGENT HMST KT MTR STRL THRMB (HEMOSTASIS) ×1
ANCHOR TIS RET SYS 235ML (MISCELLANEOUS) ×2 IMPLANT
APPLICATOR SURGIFLO ENDO (HEMOSTASIS) ×2 IMPLANT
APPLIER CLIP LOGIC TI 5 (MISCELLANEOUS) IMPLANT
BAG URINE DRAIN 2000ML AR STRL (UROLOGICAL SUPPLIES) ×2 IMPLANT
BLADE CLIPPER SURG (BLADE) ×2 IMPLANT
BULB RESERV EVAC DRAIN JP 100C (MISCELLANEOUS) IMPLANT
CATH DRAINAGE MALECOT 26FR (CATHETERS) ×2 IMPLANT
CATH FOL 2WAY LX 18X5 (CATHETERS) ×4 IMPLANT
CATH MALECOT (CATHETERS) ×2
CLIP LIGATING HEM O LOK PURPLE (MISCELLANEOUS) ×8 IMPLANT
COVER TIP SHEARS 8 DVNC (MISCELLANEOUS) ×2 IMPLANT
COVER TIP SHEARS 8MM DA VINCI (MISCELLANEOUS) ×2
DEFOGGER SCOPE WARMER CLEARIFY (MISCELLANEOUS) ×2 IMPLANT
DERMABOND ADVANCED (GAUZE/BANDAGES/DRESSINGS) ×2
DERMABOND ADVANCED .7 DNX12 (GAUZE/BANDAGES/DRESSINGS) ×2 IMPLANT
DRAIN CHANNEL JP 15F RND 16 (MISCELLANEOUS) IMPLANT
DRAIN CHANNEL JP 19F (MISCELLANEOUS) IMPLANT
DRAPE 3/4 80X56 (DRAPES) ×2 IMPLANT
DRAPE ARM DVNC X/XI (DISPOSABLE) ×8 IMPLANT
DRAPE COLUMN DVNC XI (DISPOSABLE) ×2 IMPLANT
DRAPE DA VINCI XI ARM (DISPOSABLE) ×8
DRAPE DA VINCI XI COLUMN (DISPOSABLE) ×2
DRAPE LEGGINS SURG 28X43 STRL (DRAPES) ×2 IMPLANT
DRAPE SURG 17X11 SM STRL (DRAPES) ×8 IMPLANT
DRAPE UNDER BUTTOCK W/FLU (DRAPES) ×2 IMPLANT
DRSG TELFA 3X8 NADH (GAUZE/BANDAGES/DRESSINGS) ×2 IMPLANT
ELECT REM PT RETURN 9FT ADLT (ELECTROSURGICAL) ×2
ELECTRODE REM PT RTRN 9FT ADLT (ELECTROSURGICAL) ×2 IMPLANT
GLOVE BIO SURGEON STRL SZ 6.5 (GLOVE) ×4 IMPLANT
GLOVE SURG UNDER POLY LF SZ7.5 (GLOVE) ×2 IMPLANT
GOWN STRL REUS W/ TWL LRG LVL3 (GOWN DISPOSABLE) ×4 IMPLANT
GOWN STRL REUS W/ TWL XL LVL3 (GOWN DISPOSABLE) ×2 IMPLANT
GOWN STRL REUS W/TWL LRG LVL3 (GOWN DISPOSABLE) ×4
GOWN STRL REUS W/TWL XL LVL3 (GOWN DISPOSABLE) ×2
GRASPER SUT TROCAR 14GX15 (MISCELLANEOUS) ×2 IMPLANT
HEMOSTAT SURGICEL 2X14 (HEMOSTASIS) IMPLANT
HOLDER FOLEY CATH W/STRAP (MISCELLANEOUS) ×2 IMPLANT
IRRIGATION STRYKERFLOW (MISCELLANEOUS) ×2 IMPLANT
IRRIGATOR STRYKERFLOW (MISCELLANEOUS) ×2
KIT PINK PAD W/HEAD ARE REST (MISCELLANEOUS) ×2
KIT PINK PAD W/HEAD ARM REST (MISCELLANEOUS) ×2 IMPLANT
LABEL OR SOLS (LABEL) ×2 IMPLANT
MANIFOLD NEPTUNE II (INSTRUMENTS) ×2 IMPLANT
MARKER SKIN DUAL TIP RULER LAB (MISCELLANEOUS) ×2 IMPLANT
NDL INSUFFLATION 14GA 120MM (NEEDLE) ×2 IMPLANT
NEEDLE HYPO 22GX1.5 SAFETY (NEEDLE) ×2 IMPLANT
NEEDLE INSUFFLATION 14GA 120MM (NEEDLE) ×2 IMPLANT
NS IRRIG 500ML POUR BTL (IV SOLUTION) ×2 IMPLANT
OBTURATOR OPTICAL STANDARD 8MM (TROCAR) ×2
OBTURATOR OPTICAL STND 8 DVNC (TROCAR) ×2
OBTURATOR OPTICALSTD 8 DVNC (TROCAR) ×2 IMPLANT
PACK LAP CHOLECYSTECTOMY (MISCELLANEOUS) ×2 IMPLANT
PAD DRESSING TELFA 3X8 NADH (GAUZE/BANDAGES/DRESSINGS) ×2 IMPLANT
PENCIL SMOKE EVACUATOR (MISCELLANEOUS) IMPLANT
RELOAD STAPLE 60 2.6 WHT THN (STAPLE) IMPLANT
RELOAD STAPLER WHITE 60MM (STAPLE) IMPLANT
SEAL CANN UNIV 5-8 DVNC XI (MISCELLANEOUS) ×8 IMPLANT
SEAL XI 5MM-8MM UNIVERSAL (MISCELLANEOUS) ×8
SET TUBE SMOKE EVAC HIGH FLOW (TUBING) ×2 IMPLANT
SOLUTION ELECTROLUBE (MISCELLANEOUS) ×2 IMPLANT
SPONGE T-LAP 4X18 ~~LOC~~+RFID (SPONGE) ×2 IMPLANT
SPONGE VERSALON 4X4 4PLY (MISCELLANEOUS) IMPLANT
STAPLE ECHEON FLEX 60 POW ENDO (STAPLE) IMPLANT
STAPLER RELOAD WHITE 60MM (STAPLE)
STAPLER SKIN PROX 35W (STAPLE) ×2 IMPLANT
STRAP SAFETY 5IN WIDE (MISCELLANEOUS) ×4 IMPLANT
SURGIFLO W/THROMBIN 8M KIT (HEMOSTASIS) ×2 IMPLANT
SURGILUBE 2OZ TUBE FLIPTOP (MISCELLANEOUS) ×2 IMPLANT
SUT DVC VLOC 90 3-0 CV23 UNDY (SUTURE) ×4 IMPLANT
SUT DVC VLOC 90 3-0 CV23 VLT (SUTURE) ×2
SUT ETHILON 3-0 FS-10 30 BLK (SUTURE)
SUT MNCRL 4-0 (SUTURE) ×4
SUT MNCRL 4-0 27XMFL (SUTURE) ×4
SUT SILK 2 0 SH (SUTURE) IMPLANT
SUT VIC AB 0 CT1 36 (SUTURE) ×4 IMPLANT
SUT VIC AB 2-0 SH 27 (SUTURE) ×2
SUT VIC AB 2-0 SH 27XBRD (SUTURE) IMPLANT
SUT VICRYL 0 AB UR-6 (SUTURE) ×2 IMPLANT
SUTURE DVC VLC 90 3-0 CV23 VLT (SUTURE) ×2 IMPLANT
SUTURE EHLN 3-0 FS-10 30 BLK (SUTURE) IMPLANT
SUTURE MNCRL 4-0 27XMF (SUTURE) ×4 IMPLANT
SYR 10ML LL (SYRINGE) ×2 IMPLANT
SYR BULB IRRIG 60ML STRL (SYRINGE) ×2 IMPLANT
SYR TOOMEY 50ML (SYRINGE) ×4 IMPLANT
TAPE CLOTH 3X10 WHT NS LF (GAUZE/BANDAGES/DRESSINGS) ×4 IMPLANT
TRAP FLUID SMOKE EVACUATOR (MISCELLANEOUS) ×2 IMPLANT
TROCAR XCEL 12X100 BLDLESS (ENDOMECHANICALS) ×2 IMPLANT
TROCAR XCEL NON-BLD 5MMX100MML (ENDOMECHANICALS) ×2 IMPLANT
WATER STERILE IRR 3000ML UROMA (IV SOLUTION) ×2 IMPLANT
WATER STERILE IRR 500ML POUR (IV SOLUTION) ×2 IMPLANT
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
## 2022-08-25 NOTE — Op Note (Signed)
08/25/22  PREOPERATIVE DIAGNOSIS: Prostate cancer.  POSTOPERATIVE DIAGNOSIS: Prostate cancer.  OPERATION PERFORMED: 1. DaVinci laparoscopic radical prostatectomy (full nerve sparing on right, partial nerve sparing on the left nerve sparing) 2 DaVinci laproscopic bilateral pelvic lymph node dissection.  SURGEON: Hollice Espy, MD  ASSISTANTS: Nickolas Madrid, MD  ANESTHESIA: General.  EBL: 50 cc  SPECIMEN: Prostate with bilateral seminal vesicals, bilateral pelvic lymph nodes, anterior fat pad.   INDICATION: Pt.is a 61 year old male with Gleason 3+4 prostate cancer. Treatment options were discussed with him at length and he chose DaVinci radical prostatectomy.  Excellent baseline erections therefore plan for at least full nerve sparing on right and partial on the left was discussed. Bilateral pelvic lymph node dissection was planned due to his risk stratification.  PROCEDURE IN DETAIL: Patient was given appropriate perioperative antibiotics. He had sequential compression devices applied preoperatively for DVT prophylaxis. He was taken to the operating room where he was induced with general anesthesia. After adequate anesthesia, he was placed in the dorsal lithotomy position. His arms were draped by his side and was appropriately padded and secured to the operating room table. He was placed in the Trendelenburg position.  He was prepped and draped in sterile fashion. An 29 French Foley was placed in the bladder and instilled with 15 cc sterile water. Orogastric tube was placed. The Veress needle was passed just above the umbilicus and the abdomen was insufflated to 15 atmospheres. An 8 mm, blunt-tip trocar was placed just above the umbilicus. The zero-degree camera was passed within this and the following trocars were placed under direct vision; 8 mm robotic trocars were placed 9 cm laterally and inferiorly to the initially placed umbilical trocar. A third one was  placed 7 cm lateral to the left-sided trocar. In the corresponding position on the right side, a 12 mm trocar was placed, and then a 5 mm trocar was placed to the right and well above the umbilicus.  The 12 mm assistant port site was preclosed using 0 Vicryl suture using a Carter-Thomason which was tied down at the end of the case to close this port site.  The robot was then docked with the robot trocar. I used the zero-degree camera. I had the hot scissors in the right hand and the left hand with the [place] bipolar and far left hand the Prograsp forceps. Initially I divided the median umbilical ligament bilaterally and the urachus and developed the space of Retzius down to pubic bone. I divided the parietal peritoneum laterally up to the vas deferens on each side. I used the prograsp forceps to provide cranial traction on the urachus. I cleaned off the Endopelvic fascia on each side and then divided it with the scissors laterally to the perirectal fat and medially to the puboprostatic ligaments which were divided. I then ligated the dorsal vein complex 0 Vicryl suture on a CT1 x2.  I then addressed the bladder neck. I identified the bladder neck by pulling on the Foley catheter. I divided the anterior bladder neck musculature until I then found the anterior bladder neck mucosa which was incised. I identified the Foley catheter within, deflated the balloon, pulled the Foley out through this opening and then using the Carter-Thomason needle with a #0-Vicryl suture, passed through The suprapubic region and pulled the suture through the eye of the Foley and then back out. This allowed me to provide upward traction on the prostate. I then divided the lateral bladder neck mucosa and the posterior bladder neck mucosa.  I was well away from ureteral orifices. I divided the posterior bladder neck musculature until I identified the vas deferens. They were freed proximally, then divided. I  freed up the seminal vesicals using blunt and sharp dissection. Judicious use of electrocautery was used near the seminal vesicle tips to avoid injury to neurovascular bundle.   I divided the Denonvilliers fascia beneath the prostate and developed the prostate off the rectum. I then did a full nerve sparing on the right nerve sparing by  creating a plane which was intrafascial and partial nerve sparing on the left. I then isolated the pedicles of the prostate and placed weck clips on the pedicles of the prostate and then divided it with cold scissors. I continued to divide the neurovascular bundles off the prostate out to the apex of the prostate. At this point the prostate was freed up except for the urethra. I addressed the prostate anteriorly, divided the dorsal vein , then the anterior urethral wall, pulled the Foley catheter back and then divided the posterior urethral wall. Specimen was completely freed up. I placed the prostate in an Endo catch bag and then placed the bag in the upper abdomen out of the way. I then irrigated the pelvis. The rectal test was negative. There was reasonable hemostasis.  I then did the pelvic lymph node dissection by incising the fascia overlying the right external iliac vein, dissecting distally. I went just distal to the node of [place] where we placed clips and then divided the lymphatics. The lateral aspect of the dissection was the pelvic side wall, inferior was the obturator nerve and proximal the hypogastric vessels. I placed clips at the proximal aspect and then divided the lymphatics. This was removed with the spoon grasper and sent to pathology.   I then did the left obturator lymph node dissection in the same fashion as the left side.  With good hemostasis, I then did the posterior reconstruction. I used a 3-0 VLoc suture through the cut edge of Denonvilliers fascia beneath the bladder on the right side and through the posterior striated  sphincter underneath the urethra. This brought the bladder neck and urethra and closer proximity to help facilitate anastomosis.   I then did the urethral vesicle anastomosis again with two 3-0 VLoc sutures interlocked. I passed both ends of the suture from the outside-in through the bladder neck at the 6 o'clock position. I passed both through the urethral stump from the inside-out in the corresponding position. I reapproximated the bladder neck to the urethra. I then ran the Left suture on the left side anastomosis to the 9 o'clock position. Then I went back to the right sided suture and ran that up the right side to the 12 o'clock position. I then continued the left suture to the 12 o'clock position. The suture was then suspended anteriorly behind the pubic bone.   I then placed a new 75 French Foley into the bladder and filled it with 10 cc sterile water. I irrigated the bladder with 160 cc. There was no leakage. There was reasonable hemostasis.  Surgicel was used on either side of the pedicles for an additional hemostasis.  Surgi-Flo was also used. The instruments were then removed. The robot was undocked and all the trocars were removed under direct vision. There was good hemostasis. I then enlarged the umbilical trocar site large enough to remove the prostate and I closed the fascia here with #0-0 Vicryl suture in a running fashion. All the port sites  were irrigated. Lidocaine was injected into all the trocar sites. The skin was closed with 4-0 Monocryl in running subcuticular fashion. Dermabond was applied.   At this point patient was awakened and extubated in the operating room and taken to the recovery room in stable condition. There were no complications. All counts correct.  An assistant was required for this surgical procedure. The duties of the assistant included but were not limited to suctioning, passing suture, camera manipulation, retraction. This procedure would  not be able to be performed without an Environmental consultant.    Hollice Espy, MD
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
## 2022-08-25 NOTE — Anesthesia Postprocedure Evaluation (Signed)
Anesthesia Post Note  Patient: Joel Burke  Procedure(s) Performed: XI ROBOTIC ASSISTED LAPAROSCOPIC RADICAL PROSTATECTOMY PELVIC LYMPH NODE DISSECTION (Bilateral)  Patient location during evaluation: PACU Anesthesia Type: General Level of consciousness: awake and alert Pain management: pain level controlled Vital Signs Assessment: post-procedure vital signs reviewed and stable Respiratory status: spontaneous breathing, nonlabored ventilation, respiratory function stable and patient connected to nasal cannula oxygen Cardiovascular status: blood pressure returned to baseline and stable Postop Assessment: no apparent nausea or vomiting Anesthetic complications: no   No notable events documented.   Last Vitals:  Vitals:   08/25/22 1540 08/25/22 1545  BP: (!) 148/93   Pulse: 60 (!) 56  Resp: (!) 6 (!) 22  Temp:    SpO2: 99% 97%    Last Pain:  Vitals:   08/25/22 1545  TempSrc:   PainSc: 1                  Arita Miss

## 2022-08-25 NOTE — Transfer of Care (Addendum)
Immediate Anesthesia Transfer of Care Note  Patient: Joel Burke  Procedure(s) Performed: XI ROBOTIC ASSISTED LAPAROSCOPIC RADICAL PROSTATECTOMY PELVIC LYMPH NODE DISSECTION (Bilateral)  Patient Location: PACU  Anesthesia Type:General  Level of Consciousness: drowsy  Airway & Oxygen Therapy: Patient Spontanous Breathing and Patient connected to face mask oxygen  Post-op Assessment: Report given to RN and Post -op Vital signs reviewed and stable  Post vital signs: Reviewed and stable  Last Vitals:  Vitals Value Taken Time  BP    Temp    Pulse    Resp    SpO2      Last Pain:  Vitals:   08/25/22 0942  TempSrc: Temporal  PainSc: 0-No pain      Patients Stated Pain Goal: 0 (02/09/16 3567)  Complications: No notable events documented.

## 2022-08-25 NOTE — Anesthesia Procedure Notes (Signed)
Procedure Name: Intubation Date/Time: 08/25/2022 11:42 AM  Performed by: Beverely Low, CRNAPre-anesthesia Checklist: Patient identified, Patient being monitored, Timeout performed, Emergency Drugs available and Suction available Patient Re-evaluated:Patient Re-evaluated prior to induction Oxygen Delivery Method: Circle system utilized Preoxygenation: Pre-oxygenation with 100% oxygen Induction Type: IV induction Ventilation: Mask ventilation without difficulty Laryngoscope Size: McGraph and 4 Grade View: Grade I Tube type: Oral Tube size: 7.5 mm Number of attempts: 1 Airway Equipment and Method: Stylet Placement Confirmation: ETT inserted through vocal cords under direct vision, positive ETCO2 and breath sounds checked- equal and bilateral Secured at: 22 cm Tube secured with: Tape Dental Injury: Teeth and Oropharynx as per pre-operative assessment  Comments: Lauren Cozart, SRNA placed ETT under supervision.

## 2022-08-26 ENCOUNTER — Encounter: Payer: Self-pay | Admitting: Urology

## 2022-08-26 DIAGNOSIS — C61 Malignant neoplasm of prostate: Principal | ICD-10-CM

## 2022-08-26 LAB — CBC
HCT: 40.2 % (ref 39.0–52.0)
Hemoglobin: 13.1 g/dL (ref 13.0–17.0)
MCH: 28.5 pg (ref 26.0–34.0)
MCHC: 32.6 g/dL (ref 30.0–36.0)
MCV: 87.6 fL (ref 80.0–100.0)
Platelets: 216 10*3/uL (ref 150–400)
RBC: 4.59 MIL/uL (ref 4.22–5.81)
RDW: 13.6 % (ref 11.5–15.5)
WBC: 14 10*3/uL — ABNORMAL HIGH (ref 4.0–10.5)
nRBC: 0 % (ref 0.0–0.2)

## 2022-08-26 LAB — BASIC METABOLIC PANEL
Anion gap: 4 — ABNORMAL LOW (ref 5–15)
BUN: 21 mg/dL — ABNORMAL HIGH (ref 6–20)
CO2: 25 mmol/L (ref 22–32)
Calcium: 8.1 mg/dL — ABNORMAL LOW (ref 8.9–10.3)
Chloride: 111 mmol/L (ref 98–111)
Creatinine, Ser: 1.26 mg/dL — ABNORMAL HIGH (ref 0.61–1.24)
GFR, Estimated: >60 mL/min (ref >60)
Glucose, Bld: 130 mg/dL — ABNORMAL HIGH (ref 70–99)
Potassium: 4.3 mmol/L (ref 3.5–5.1)
Sodium: 140 mmol/L (ref 135–145)

## 2022-08-26 MED ORDER — OXYBUTYNIN CHLORIDE 5 MG PO TABS
ORAL_TABLET | ORAL | Status: AC
  Filled 2022-08-26: qty 2

## 2022-08-26 MED ORDER — MORPHINE SULFATE (PF) 4 MG/ML IV SOLN
INTRAVENOUS | Status: AC
  Administered 2022-08-26: 4 mg
  Filled 2022-08-26: qty 1

## 2022-08-26 MED ORDER — CEFAZOLIN SODIUM-DEXTROSE 2-4 GM/100ML-% IV SOLN
INTRAVENOUS | Status: AC
  Filled 2022-08-26: qty 100

## 2022-08-26 MED ORDER — OXYCODONE-ACETAMINOPHEN 5-325 MG PO TABS: 1.0000 | ORAL_TABLET | Freq: Four times a day (QID) | ORAL | 0 refills | Status: DC | PRN

## 2022-08-26 MED ORDER — OXYCODONE-ACETAMINOPHEN 5-325 MG PO TABS
ORAL_TABLET | ORAL | Status: AC
  Filled 2022-08-26: qty 2

## 2022-08-26 MED ORDER — HEPARIN SODIUM (PORCINE) 5000 UNIT/ML IJ SOLN
INTRAMUSCULAR | Status: AC
  Administered 2022-08-26: 5000 [IU]
  Filled 2022-08-26: qty 1

## 2022-08-26 MED ORDER — OXYCODONE-ACETAMINOPHEN 5-325 MG PO TABS
ORAL_TABLET | ORAL | Status: AC
  Administered 2022-08-26: 2 via ORAL
  Filled 2022-08-26: qty 2

## 2022-08-26 MED ORDER — DOCUSATE SODIUM 100 MG PO CAPS: 100.0000 mg | ORAL_CAPSULE | Freq: Two times a day (BID) | ORAL | 0 refills | Status: DC

## 2022-08-26 MED ORDER — OXYBUTYNIN CHLORIDE 5 MG PO TABS
5.0000 mg | ORAL_TABLET | Freq: Three times a day (TID) | ORAL | Status: DC | PRN
  Administered 2022-08-26: 5 mg via ORAL

## 2022-08-26 MED ORDER — OXYBUTYNIN CHLORIDE 5 MG PO TABS: 5.0000 mg | ORAL_TABLET | Freq: Three times a day (TID) | ORAL | 0 refills | Status: DC | PRN

## 2022-08-26 MED ORDER — DOCUSATE SODIUM 100 MG PO CAPS
ORAL_CAPSULE | ORAL | Status: AC
  Administered 2022-08-26: 100 mg via ORAL
  Filled 2022-08-26: qty 1

## 2022-08-26 MED ORDER — LORATADINE 10 MG PO TABS
ORAL_TABLET | ORAL | Status: AC
  Administered 2022-08-26: 10 mg via ORAL
  Filled 2022-08-26: qty 1

## 2022-08-26 NOTE — Progress Notes (Signed)
Nsg Discharge Note  Admit Date:  08/25/2022 Discharge date: 08/26/2022   Hulen Skains III to be D/C'd Home per MD order.  AVS completed.   Patient/caregiver able to verbalize understanding.  Discharge Medication: Allergies as of 08/26/2022       Reactions   Sulfa Antibiotics    Other reaction(s): rash   Sulfonamide Derivatives Hives   Sulfasalazine Rash        Medication List     TAKE these medications    acetaminophen 500 MG tablet Commonly known as: TYLENOL Take 2 tablets (1,000 mg total) by mouth every 6 (six) hours as needed.   atorvastatin 10 MG tablet Commonly known as: LIPITOR Take 10 mg by mouth daily.   cetirizine 10 MG tablet Commonly known as: ZYRTEC Take 10 mg by mouth daily as needed.   docusate sodium 100 MG capsule Commonly known as: COLACE Take 1 capsule (100 mg total) by mouth 2 (two) times daily.   Glucosamine 500 MG Caps Take 500 mg by mouth as needed.   oxybutynin 5 MG tablet Commonly known as: DITROPAN Take 1 tablet (5 mg total) by mouth every 8 (eight) hours as needed for bladder spasms.   oxyCODONE-acetaminophen 5-325 MG tablet Commonly known as: PERCOCET/ROXICET Take 1-2 tablets by mouth every 6 (six) hours as needed for moderate pain.        Discharge Assessment: Vitals:   08/26/22 0503 08/26/22 1410  BP: 112/62 128/74  Pulse: 62 65  Resp: 16 15  Temp: 98 F (36.7 C) 98 F (36.7 C)  SpO2: 96% 94%   Skin clean, dry and intact without evidence of skin break down, no evidence of skin tears noted. IV catheter discontinued intact. Site without signs and symptoms of complications - no redness or edema noted at insertion site, patient denies c/o pain - only slight tenderness at site.  Dressing with slight pressure applied.  D/c Instructions-Education: Discharge instructions given to patient/family with verbalized understanding. D/c education completed with patient/family including follow up instructions, medication list, d/c  activities limitations if indicated, with other d/c instructions as indicated by MD - patient able to verbalize understanding, all questions fully answered. Patient instructed to return to ED, call 911, or call MD for any changes in condition.  Patient given catheter care supplies and instructions. Patient escorted via [place], and D/C home via private auto.   Tresa Endo, RN 08/26/2022 2:22 PM\

## 2022-08-26 NOTE — Discharge Summary (Signed)
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 ([place])    Anterior to posterior tear of superior glenoid labrum of left shoulder 03/12/2021   Adhesive capsulitis of left shoulder 03/12/2021   Rib fractures 11/18/2020   Clavicle fracture 11/18/2020   Left pulmonary contusion 11/18/2020   Fall from ladder 11/18/2020   Impaired fasting glucose 05/16/2020   Pain in right foot 01/15/2017   Snoring 11/08/2013   Inguinal hernia 11/08/2013   Gastro-esophageal reflux disease without esophagitis 10/05/2012   Hyperlipidemia 07/07/2011   Family history of prostate cancer 07/07/2011   ED (erectile dysfunction) of organic origin 07/07/2011   ULCERATIVE COLITIS 06/20/2010   SHOULDER PAIN, LEFT 06/20/2010   RENAL CALCULUS, HX OF 06/20/2010   History and Physical: For full details, please see admission history and physical. Briefly, Joel Burke is a 61 y.o. year old patient admitted on 08/25/2022 for scheduled robotic assisted laparoscopic radical prostatectomy, full nerve sparing on the right and partial nerve sparing on the left, with bilateral pelvic lymph node dissection with Dr. Erlene Quan.   This morning he reports he is feeling well and his pain has been well controlled.  He is having some bladder pressure that is uncomfortable for him.  Foley catheter in place draining clear, yellow urine.  Diet has been advanced but he has not yet ambulated.  He has not yet passed flatus.  As of this afternoon, he has tolerated solids without nausea or vomiting. He has ambulated without difficulty.  Physical Exam: Constitutional:  Alert and  oriented, no acute distress, nontoxic appearing HEENT: [place], AT Cardiovascular: No clubbing, cyanosis, or edema Respiratory: Normal respiratory effort, no increased work of breathing GI: Abdomen is soft with appropriate postoperative tenderness without rigidity, guarding, or rebound.  Surgical incisions noted over the anterior abdomen, all clean, dry, and intact with overlying surgical adhesive. Skin: No rashes, bruises or suspicious lesions Neurologic: Grossly intact, no focal deficits, moving all 4 extremities Psychiatric: Normal mood and affect   Hospital Course: Patient tolerated the procedure well.  He was then transferred to the floor after an uneventful PACU stay.  His hospital course was uncomplicated.  On POD#1 he had met discharge criteria: was eating a regular diet, was up and ambulating independently,  pain was well controlled, catheter was draining well, and was ready for discharge.  Laboratory values:  Recent Labs    08/25/22 1601 08/26/22 0403  WBC 19.6* 14.0*  HGB 15.0 13.1  HCT 45.2 40.2   Results for orders placed or performed during the hospital encounter of 08/19/22  Urine Culture     Status: None   Collection Time: 08/19/22  9:31 AM   Specimen: Urine, Clean Catch  Result Value Ref Range Status   Specimen Description   Final    URINE, CLEAN CATCH Performed at [facility], [address] [place], [place] [address]    Special Requests   Final    NONE Performed at [facility], [address] [place], [place] [address]    Culture   Final    NO GROWTH Performed at [facility], [place] [address] [place], [place]  [address]    Report Status 08/24/2022 FINAL  Final   Disposition: Home  Discharge instruction: The patient was instructed to be ambulatory but told to refrain from heavy lifting, strenuous activity, or driving.  Catheter care instructions provided by nursing.  Discharge medications:  Allergies as of 08/26/2022        Reactions   Sulfa Antibiotics    Other reaction(s): rash   Sulfonamide Derivatives Hives   Sulfasalazine Rash        Medication List     TAKE these medications    acetaminophen 500 MG tablet Commonly known as: TYLENOL Take 2 tablets (1,000 mg total) by mouth every 6 (six) hours as needed.   atorvastatin 10 MG tablet Commonly known as: LIPITOR Take 10 mg by mouth daily.   cetirizine 10 MG tablet Commonly known as: ZYRTEC Take 10 mg by mouth daily as needed.   docusate sodium 100 MG capsule Commonly known as: COLACE Take 1 capsule (100 mg total) by mouth 2 (two) times daily.   Glucosamine 500 MG Caps Take 500 mg by mouth as needed.   oxybutynin 5 MG tablet Commonly known as: DITROPAN Take 1 tablet (5 mg total) by mouth every 8 (eight) hours as needed for bladder spasms.   oxyCODONE-acetaminophen 5-325 MG tablet Commonly known as: PERCOCET/ROXICET Take 1-2 tablets by mouth every 6 (six) hours as needed for moderate pain.        Followup:   Follow-up Information     Debroah Loop, [place]. Go on 09/02/2022.   Specialty: Urology Why: For catheter removal Contact information: [place] [address] [phone]

## 2022-08-28 LAB — SURGICAL PATHOLOGY

## 2022-09-02 ENCOUNTER — Ambulatory Visit (INDEPENDENT_AMBULATORY_CARE_PROVIDER_SITE_OTHER): Payer: BC Managed Care – PPO | Admitting: Physician Assistant

## 2022-09-02 ENCOUNTER — Other Ambulatory Visit: Payer: Self-pay | Admitting: Physician Assistant

## 2022-09-02 DIAGNOSIS — C61 Malignant neoplasm of prostate: Secondary | ICD-10-CM

## 2022-09-02 MED ORDER — CIPROFLOXACIN HCL 500 MG PO TABS
500.0000 mg | ORAL_TABLET | Freq: Once | ORAL | Status: AC
  Administered 2022-09-02: 500 mg via ORAL

## 2022-09-02 NOTE — Progress Notes (Signed)
Catheter Removal  Patient is present today for a catheter removal.  34m of water was drained from the balloon. A 18FR foley cath was removed from the bladder no complications were noted . Patient tolerated well.  Performed by: SDebroah Loop PA-C  Additional notes: 1 dose PO Cipro 5015mgiven prior to Foley removal. Surgical pathology results shared: Gleason 3+4 acinar adenocarcinoma, negative nodes, negative margins. Having BMs, postop pain minimal, Foley draining clear, yellow urine. Abdomen is soft, minimally tender, minimal focal bruising, no rebound or rigidity.  Follow up: 6 week postop f/u with PSA prior with Dr. BrErlene Quan

## 2022-09-19 ENCOUNTER — Ambulatory Visit
Admission: RE | Admit: 2022-09-19 | Discharge: 2022-09-19 | Disposition: A | Discharge status: Home / Self Care | Payer: No Typology Code available for payment source | Source: Ambulatory Visit | Attending: Internal Medicine | Admitting: Internal Medicine

## 2022-09-19 DIAGNOSIS — E785 Hyperlipidemia, unspecified: Secondary | ICD-10-CM

## 2022-09-30 ENCOUNTER — Encounter: Payer: Self-pay | Admitting: Urology

## 2022-10-01 ENCOUNTER — Encounter: Payer: Self-pay | Admitting: Urology

## 2022-10-02 ENCOUNTER — Other Ambulatory Visit: Payer: Self-pay | Admitting: *Deleted

## 2022-10-02 DIAGNOSIS — C61 Malignant neoplasm of prostate: Secondary | ICD-10-CM

## 2022-10-03 ENCOUNTER — Other Ambulatory Visit: Payer: BC Managed Care – PPO

## 2022-10-03 DIAGNOSIS — C61 Malignant neoplasm of prostate: Secondary | ICD-10-CM

## 2022-10-04 LAB — PSA: Prostate Specific Ag, Serum: <0.1 ng/mL (ref 0.0–4.0)

## 2022-10-07 ENCOUNTER — Ambulatory Visit: Payer: BC Managed Care – PPO | Admitting: Urology

## 2022-10-07 VITALS — BP 139/96 | HR 63 | Ht 71.0 in | Wt 186.6 lb

## 2022-10-07 DIAGNOSIS — C61 Malignant neoplasm of prostate: Secondary | ICD-10-CM

## 2022-10-07 DIAGNOSIS — N5231 Erectile dysfunction following radical prostatectomy: Secondary | ICD-10-CM

## 2022-10-07 DIAGNOSIS — Z8546 Personal history of malignant neoplasm of prostate: Secondary | ICD-10-CM

## 2022-10-07 DIAGNOSIS — N393 Stress incontinence (female) (male): Secondary | ICD-10-CM

## 2022-10-07 MED ORDER — SILDENAFIL CITRATE 100 MG PO TABS: 100.0000 mg | ORAL_TABLET | Freq: Every day | ORAL | 3 refills | Status: DC | PRN
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
## 2022-10-07 NOTE — Addendum Note (Signed)
Addended by: Amado Coe on: 10/07/2022 11:27 AM   Modules accepted: Orders

## 2022-10-13 ENCOUNTER — Ambulatory Visit: Payer: BC Managed Care – PPO | Admitting: Physical Therapy

## 2022-10-20 ENCOUNTER — Ambulatory Visit: Payer: BC Managed Care – PPO | Attending: Urology | Admitting: Physical Therapy

## 2022-10-20 ENCOUNTER — Encounter: Payer: Self-pay | Admitting: Physical Therapy

## 2022-10-20 DIAGNOSIS — M2170 Unequal limb length (acquired), unspecified site: Secondary | ICD-10-CM | POA: Insufficient documentation

## 2022-10-20 DIAGNOSIS — R2689 Other abnormalities of gait and mobility: Secondary | ICD-10-CM | POA: Insufficient documentation

## 2022-10-20 DIAGNOSIS — R278 Other lack of coordination: Secondary | ICD-10-CM | POA: Diagnosis present
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
## 2022-10-20 NOTE — Patient Instructions (Signed)
PELVIC FLOOR / KEGEL EXERCISES   Pelvic floor/ Kegel exercises are used to strengthen the muscles in the base of your pelvis that are responsible for supporting your pelvic organs and preventing urine/feces leakage. Based on your therapist's recommendations, they can be performed while standing, sitting, or lying down.  Make yourself aware of this muscle group by using these cues: Blueberry imagery Telescope imagery Arcade machine plush animal suction lift  Jelly fish lift   Focus on the perineum, the center of the pelvic floor muscles to engage the whole set of muscles , lowering like a bowl on inhale as ribs expand like tree rings, on exhale, pelvic at the perineum gently lifts , low belly sinks, upper belly sinks.   Common Errors: Breath holding: If you are holding your breath, you may be bearing down against your bladder instead of pulling it up. If you belly bulges up while you are squeezing, you are holding your breath. Be sure to breathe gently in and out while exercising. Counting out loud may help you avoid holding your breath. Accessory muscle use: You should not see or feel other muscle movement when performing pelvic floor exercises. When done properly, no one can tell that you are performing the exercises. Keep the buttocks, belly and inner thighs relaxed. Overdoing it: Your muscles can fatigue and stop working for you if you over-exercise. You may actually leak more or feel soreness at the lower abdomen or rectum.  YOUR HOME EXERCISE PROGRAM  LONG HOLDS: Position: on back Inhale and then exhale. Then squeeze the muscle and count aloud for 3 seconds. Rest with three long breaths. (Be sure to let belly sink in with exhales and not push outward) Perform _4_  repetitions, before and after deep core level 1 and 2, in the morning and night    ( also semi-reclined in the car seat parked at lunch to total 3 sets of 3 sec, 4 reps per day)   SHORT HOLDS: Position: on sitting  Inhale  and then exhale. Then squeeze the muscle.  (Be sure to let belly sink in with exhales and not push outward) Perform 3 repetitions, 3 different times of the day at meal time  + plus mid morning, mid afternoon ( total of 5 / day)     **ALSO SQUEEZE BEFORE YOUR SNEEZE, COUGH, LAUGH to decrease downward pressure   **ALSO EXHALE BEFORE YOU RISE AGAINST GRAVITY (lifting, sit to stand, from squat to stand)

## 2022-10-27 ENCOUNTER — Ambulatory Visit: Payer: BC Managed Care – PPO | Admitting: Physical Therapy

## 2022-10-28 ENCOUNTER — Encounter: Payer: Self-pay | Admitting: Urology

## 2022-10-29 MED ORDER — AMBULATORY NON FORMULARY MEDICATION: 0 refills | Status: DC

## 2022-11-10 NOTE — Progress Notes (Deleted)
   11/10/2022 8:28 AM  Hulen Skains III 11/03/1961 [id number]   Referring provider: Crist Infante, MD [address] [place],  [place] [address]  Urological history 1. Prostate cancer -PSA (09/2022) <0.1 -pT2N0  -radical prostatectomy w/ bilateral pelvic lymph node dissection (01/2022) -surgical pathology was consistent with Gleason 3+4 disease, negative margins, negative lymph nodes, no evidence of extracapsular extension or seminal vesicle invasion   2. SUI -contributing factors of age, prostate cancer, prostatectomy and ?sleep apnea -Undergoing pelvic floor physical therapy  3. Nephrolithiasis -?URS (04/2003)  4. ED -Contributing factors of age, prostate cancer, prostatectomy, CAD, hypertension, hyperlipidemia and ?sleep apnea -failed PDE5i's   No chief complaint on file.   HPI: TAKAI CHIARAMONTE III is a 61 y.o. male who presents today for ICI titration.    The procedure is discussed with patient.  He is allowed to ask questions.  Questions were answered to his satisfaction.  We were able to complete the titration.  Physical Exam:  There were no vitals taken for this visit.  Constitutional:  Well nourished. Alert and oriented, No acute distress. GU: No CVA tenderness.  No bladder fullness or masses.  Patient with circumcised/uncircumcised phallus. ***Foreskin easily retracted***  Urethral meatus is patent.  No penile discharge. No penile lesions or rashes. Scrotum without lesions, cysts, rashes and/or edema.  Testicles are located scrotally bilaterally. No masses are appreciated in the testicles. Left and right epididymis are normal. Psychiatric: Normal mood and affect.   Procedure *** Patient's left corpus cavernosum is identified.  An area near the base of the penis is cleansed with rubbing alcohol.  Careful to avoid the dorsal vein, *** mcg of Trimix (papaverine 30 mg, phentolamine 1 mg and prostaglandin E1 10 mcg, Lot # ***@*** exp # *** is injected at a 90  degree angle into the left *** corpus cavernosum near the base of the penis.  Patient experienced a very firm erection in 15 minutes.     Assessment & Plan:    1.  Erectile dysfunction ***  2. Prostate cancer -Return appointment for PSA only on April 17, 2023 -Stressed to the patient that is important to keep that appointment for continued monitoring to ensure any recurrence of prostate cancer is caught in its early stages   No follow-ups on file.  Laneta Simmers  [place] [address] [place], [place] [address] [phone]

## 2022-11-11 ENCOUNTER — Ambulatory Visit: Payer: BC Managed Care – PPO | Admitting: Urology

## 2022-11-11 DIAGNOSIS — N5231 Erectile dysfunction following radical prostatectomy: Secondary | ICD-10-CM

## 2022-11-11 DIAGNOSIS — C61 Malignant neoplasm of prostate: Secondary | ICD-10-CM

## 2022-11-12 NOTE — Progress Notes (Unsigned)
   11/13/2022 3:39 PM  Joel Burke 1961/01/01 [id number]   Referring provider: Crist Infante, MD [address] [place],  [place] [address]  Urological history 1. Prostate cancer -PSA (09/2022) <0.1 -pT2N0  -radical prostatectomy w/ bilateral pelvic lymph node dissection (01/2022) -surgical pathology was consistent with Gleason 3+4 disease, negative margins, negative lymph nodes, no evidence of extracapsular extension or seminal vesicle invasion   2. SUI -contributing factors of age, prostate cancer, prostatectomy and ?sleep apnea -Undergoing pelvic floor physical therapy  3. Nephrolithiasis -?URS (04/2003)  4. ED -Contributing factors of age, prostate cancer, prostatectomy, CAD, hypertension, hyperlipidemia and ?sleep apnea -failed PDE5i's   Chief Complaint  Patient presents with   Other    HPI: Joel Burke is a 61 y.o. male who presents today for ICI titration.    The procedure is discussed with patient.  He is allowed to ask questions.  Questions were answered to his satisfaction.  We were able to complete the titration.  Physical Exam:  BP (!) 140/87   Pulse 84   Ht 5' 11"  (1.803 m)   Wt 185 lb (83.9 kg)   BMI 25.80 kg/m   Constitutional:  Well nourished. Alert and oriented, No acute distress. GU: No CVA tenderness.  No bladder fullness or masses.  Patient with circumcised phallus.  Urethral meatus is patent.  No penile discharge. No penile lesions or rashes. Scrotum without lesions, cysts, rashes and/or edema.  Psychiatric: Normal mood and affect.   Procedure  Patient's left corpus cavernosum is identified.  An area near the base of the penis is cleansed with rubbing alcohol.  Careful to avoid the dorsal vein, 0.2 cc of Trimix (papaverine 30 mg, phentolamine 1 mg and prostaglandin E1 10 mcg, Lot # [id number]  exp # 12/08/2022 is injected at a 90 degree angle into the left  corpus cavernosum near the base of the penis.  Patient experienced a very  firm erection in 15 minutes.    Phenylephrine 200 mcg (diluted with normal saline to provide a final concentration of 100 mcg per mL) was injected into the right side of the corpus cavernosum. He achieved detumescence.  His BP was monitored during the procedure.  It was 155/81 prior to phenylephrine injection, 144/104 after the phenylephrine injection and 150/79 when detumescence was achieved.  Assessment & Plan:    1.  Erectile dysfunction -Patient had a very firm erection at 0.2 cc of Trimix -He masturbated and successfully climax, but the penis did not start to detumescence -We had to address the erection with phenylephrine in order to achieve detumescence -going forward, he will not take the sildenafil 100 mg on the day he is planning to inject the Trimix and he will inject 0.1 cc - Advised patient of the condition of priapism, painful erection lasting for more than four hours, and to contact the office or seek treatment in the ED immediately   2. Prostate cancer -Return appointment for PSA only on April 17, 2023 -Stressed to the patient that is important to keep that appointment for continued monitoring to ensure any recurrence of prostate cancer is caught in its early stages   Return for keep follow up April 2024.  May 2024  [facility] [address] [place], [place] [phone]   I spent 15 minutes on the day of the encounter to include pre-visit record review, face-to-face time with the patient, and post-visit ordering of tests.

## 2022-11-13 ENCOUNTER — Encounter: Payer: Self-pay | Admitting: Urology

## 2022-11-13 ENCOUNTER — Ambulatory Visit: Payer: BC Managed Care – PPO | Admitting: Urology

## 2022-11-13 VITALS — BP 140/87 | HR 84 | Ht 71.0 in | Wt 185.0 lb

## 2022-11-13 DIAGNOSIS — N5231 Erectile dysfunction following radical prostatectomy: Secondary | ICD-10-CM | POA: Diagnosis not present

## 2022-11-13 DIAGNOSIS — C61 Malignant neoplasm of prostate: Secondary | ICD-10-CM

## 2022-11-13 NOTE — Patient Instructions (Signed)
TRIMIX SELF-INJECTION INSTRUCTIONS    DETAILED PROCEDURE  1. GETTING SET UP  A. Proper hygiene is important. Wash your hands and keep the penis clean.  B. Assemble the following:  - Bottle of Trimix  - Alcohol pad  - Syringe  C. Keep the Trimix cold by returning the bottle to the refrigerator, or by placing the bottle in a cup of ice.   2. PREPARE THE SYRINGE  A. Wipe the rubber top of the vial with an alcohol pad.  B. After removing the cap of the needle, pull the plunger back to the desired dosage, filling this volume with air. Use a new needle and syringe each time.  C. Insert the needle through the rubber top and inject the air into the vial.  D. Turn the vial with needle and syringe inserted upside down. Pull back on the syringe plunger in a slow and steady motion until the desired dosage is achieved.  E. Tap the side of the syringe (1cc tuberculin syringe with a 29 gauge needle) to allow any air bubbles to float towards the needle. Avoid having these air bubbles in the syringe when self-injecting by first injecting out the collected bubbles that may form.  F. Remove the needle from the bottle and replace the protective cap on the needle.    3. SELECT AND PREPARE THE SITE FOR INJECTION  A. The proper location for injection is at the 9-11 and 1-3 o'clock positions, between the base and mid-portion of the penis.(see diagram) Avoid the midline because of potential for injury to the urethra (6 o'clock; for urinary passage) and the penile arteries and nerves (near 12 o'clock). Avoid any visible veins or arteries on the surface.  B. Grasp and pull the head of the penis toward the side of your leg with the index finger and thumb (use the left hand, if right handed). While maintaining light tension, select a site for injection.  C. Clean the site with an alcohol pad.   4: INJECT TRIMIX AND APPLY COMPRESSION  A. With a steady and continuous motion, penetrate the skin with the needle at a 90 o  angle. The needle should then be advanced to the hub. Slight resistance is encountered as the needle passes into the proper position within the erectile tissue (corporeal body).  B. Inject the Trimix over approximately 4 seconds. Withdraw the needle from the penis and apply compression to the injection site for approximately 1 minute. Several minutes of compression may be required to avoid bleeding, especially if you are an aspirin user.  C. Replace the cap on the needle and dispose of properly.   If you experience a painful erection that will not go down, take four (30 mg) tablets of pseudoephedrine (Sudafed-not the extended release) and if the erection does not go down in the next hour or increases in pain, contact the office immediately or seek treatment in the ED

## 2023-02-10 ENCOUNTER — Other Ambulatory Visit: Payer: Self-pay | Admitting: Urology

## 2023-04-17 ENCOUNTER — Other Ambulatory Visit: Payer: 59

## 2023-04-17 DIAGNOSIS — C61 Malignant neoplasm of prostate: Secondary | ICD-10-CM

## 2023-04-18 LAB — PSA: Prostate Specific Ag, Serum: <0.1 ng/mL (ref 0.0–4.0)

## 2023-06-15 ENCOUNTER — Other Ambulatory Visit: Payer: Self-pay

## 2023-06-15 DIAGNOSIS — N5231 Erectile dysfunction following radical prostatectomy: Secondary | ICD-10-CM

## 2023-06-15 NOTE — Telephone Encounter (Signed)
Patient left voicemail on triage line asking for refill on Trimix to [facility]. Left message asking patient to let us know what dose he is injecting.

## 2023-06-16 MED ORDER — AMBULATORY NON FORMULARY MEDICATION: 6 refills | Status: AC

## 2023-06-16 NOTE — Telephone Encounter (Signed)
Spoke with patient and he states he uses 0.15 cc to inject. Rx provided and faxed to [facility].

## 2023-06-27 ENCOUNTER — Emergency Department (HOSPITAL_COMMUNITY)
Admission: EM | Admit: 2023-06-27 | Discharge: 2023-06-27 | Disposition: A | Discharge status: Home / Self Care | Payer: 59 | Attending: Emergency Medicine | Admitting: Emergency Medicine

## 2023-06-27 ENCOUNTER — Other Ambulatory Visit: Payer: Self-pay

## 2023-06-27 ENCOUNTER — Encounter (HOSPITAL_COMMUNITY): Payer: Self-pay

## 2023-06-27 DIAGNOSIS — Z8616 Personal history of COVID-19: Secondary | ICD-10-CM | POA: Diagnosis not present

## 2023-06-27 DIAGNOSIS — Z8546 Personal history of malignant neoplasm of prostate: Secondary | ICD-10-CM | POA: Diagnosis not present

## 2023-06-27 DIAGNOSIS — N4830 Priapism, unspecified: Secondary | ICD-10-CM | POA: Diagnosis not present
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
## 2023-06-27 NOTE — ED Triage Notes (Addendum)
Pt presents w/ an erection which has lasted around 4-5hrs.  Pt reports taking prescribed dose of Trimix.  Pain score1/10.  Pt reports taking a total of 4 Sudafed and has placed ice pack on his penis.  Sts he was direct by Provider to take Sudafed to reduce erection.

## 2023-06-27 NOTE — Discharge Instructions (Signed)
Joel Burke:  Thank you for allowing [place] to take care of you today.  We hope you begin feeling better soon.  To-Do: Please follow-up with your urology team.  Call to make an appointment. Return to the emergency department for an erection lasting longer than 4 hours. Please return to the Emergency Department or call 911 if you experience chest pain, shortness of breath, severe pain, severe fever, altered mental status, or have any reason to think that you need emergency medical care.  Thank you again.  Hope you feel better soon.  Department of Emergency Medicine [facility]

## 2023-06-29 ENCOUNTER — Telehealth: Payer: Self-pay

## 2023-06-29 NOTE — Telephone Encounter (Signed)
Patient left message stating that he received his Trimix medication and injected the 0.15 cc on 06/27/23 and erection lasted almost 6 hours and he went to ER. They did not have to give him medication to stop erection as it was going down on its own at that point, Patient wanted to let us know and that he is thinking he will try 0.1 cc next time and any other feedback we have.

## 2023-06-30 NOTE — Telephone Encounter (Signed)
Patient advised.

## 2023-08-27 DIAGNOSIS — C61 Malignant neoplasm of prostate: Secondary | ICD-10-CM | POA: Diagnosis not present

## 2023-08-27 DIAGNOSIS — E785 Hyperlipidemia, unspecified: Secondary | ICD-10-CM | POA: Diagnosis not present

## 2023-08-27 DIAGNOSIS — R7301 Impaired fasting glucose: Secondary | ICD-10-CM | POA: Diagnosis not present

## 2023-09-02 DIAGNOSIS — N529 Male erectile dysfunction, unspecified: Secondary | ICD-10-CM | POA: Diagnosis not present

## 2023-09-02 DIAGNOSIS — R82998 Other abnormal findings in urine: Secondary | ICD-10-CM | POA: Diagnosis not present

## 2023-09-02 DIAGNOSIS — M25519 Pain in unspecified shoulder: Secondary | ICD-10-CM | POA: Diagnosis not present

## 2023-09-02 DIAGNOSIS — R0683 Snoring: Secondary | ICD-10-CM | POA: Diagnosis not present

## 2023-09-02 DIAGNOSIS — R7301 Impaired fasting glucose: Secondary | ICD-10-CM | POA: Diagnosis not present

## 2023-09-02 DIAGNOSIS — K4090 Unilateral inguinal hernia, without obstruction or gangrene, not specified as recurrent: Secondary | ICD-10-CM | POA: Diagnosis not present

## 2023-09-02 DIAGNOSIS — C61 Malignant neoplasm of prostate: Secondary | ICD-10-CM | POA: Diagnosis not present

## 2023-09-02 DIAGNOSIS — Z0000 Encounter for general adult medical examination without abnormal findings: Secondary | ICD-10-CM | POA: Diagnosis not present

## 2023-09-02 DIAGNOSIS — E785 Hyperlipidemia, unspecified: Secondary | ICD-10-CM | POA: Diagnosis not present

## 2023-09-02 DIAGNOSIS — K219 Gastro-esophageal reflux disease without esophagitis: Secondary | ICD-10-CM | POA: Diagnosis not present

## 2023-09-02 DIAGNOSIS — K5190 Ulcerative colitis, unspecified, without complications: Secondary | ICD-10-CM | POA: Diagnosis not present

## 2023-09-02 DIAGNOSIS — I2510 Atherosclerotic heart disease of native coronary artery without angina pectoris: Secondary | ICD-10-CM | POA: Diagnosis not present

## 2023-09-02 DIAGNOSIS — Z23 Encounter for immunization: Secondary | ICD-10-CM | POA: Diagnosis not present

## 2023-10-02 ENCOUNTER — Telehealth: Payer: Self-pay | Admitting: Genetic Counselor

## 2023-10-02 NOTE — Telephone Encounter (Signed)
Rescheduled appointments per patients request. Patient is aware of the changes made to his upcoming appointments.

## 2023-10-02 NOTE — Telephone Encounter (Signed)
Per IB Message on 09/15/23: I called patient and left a voice mail with his appointment details for Genetic Counseling and lab.  I will mail a reminder notice.

## 2023-10-07 ENCOUNTER — Other Ambulatory Visit: Payer: Self-pay | Admitting: Urology

## 2023-10-15 ENCOUNTER — Other Ambulatory Visit: Payer: Self-pay

## 2023-10-15 ENCOUNTER — Other Ambulatory Visit: Payer: BC Managed Care – PPO

## 2023-10-15 DIAGNOSIS — C61 Malignant neoplasm of prostate: Secondary | ICD-10-CM

## 2023-10-15 DIAGNOSIS — N5231 Erectile dysfunction following radical prostatectomy: Secondary | ICD-10-CM

## 2023-10-16 LAB — PSA: Prostate Specific Ag, Serum: <0.1 ng/mL (ref 0.0–4.0)

## 2023-10-20 ENCOUNTER — Encounter: Payer: 59 | Admitting: Genetic Counselor

## 2023-10-20 ENCOUNTER — Other Ambulatory Visit: Payer: 59

## 2023-10-20 ENCOUNTER — Ambulatory Visit: Payer: 59 | Admitting: Urology

## 2023-10-20 VITALS — BP 166/91 | HR 75 | Ht 71.0 in | Wt 191.4 lb

## 2023-10-20 DIAGNOSIS — N5231 Erectile dysfunction following radical prostatectomy: Secondary | ICD-10-CM | POA: Diagnosis not present

## 2023-10-20 DIAGNOSIS — N393 Stress incontinence (female) (male): Secondary | ICD-10-CM | POA: Diagnosis not present

## 2023-10-20 DIAGNOSIS — C61 Malignant neoplasm of prostate: Secondary | ICD-10-CM

## 2023-10-20 MED ORDER — SILDENAFIL CITRATE 100 MG PO TABS: 100.0000 mg | ORAL_TABLET | Freq: Every day | ORAL | 11 refills | Status: AC | PRN
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
## 2023-10-22 ENCOUNTER — Encounter: Payer: Self-pay | Admitting: Genetic Counselor

## 2023-10-22 ENCOUNTER — Inpatient Hospital Stay: Payer: 59 | Attending: Genetic Counselor | Admitting: Genetic Counselor

## 2023-10-22 ENCOUNTER — Inpatient Hospital Stay: Payer: 59

## 2023-10-22 ENCOUNTER — Other Ambulatory Visit: Payer: Self-pay

## 2023-10-22 ENCOUNTER — Other Ambulatory Visit: Payer: Self-pay | Admitting: Genetic Counselor

## 2023-10-22 DIAGNOSIS — C61 Malignant neoplasm of prostate: Secondary | ICD-10-CM

## 2023-10-22 DIAGNOSIS — Z1379 Encounter for other screening for genetic and chromosomal anomalies: Secondary | ICD-10-CM

## 2023-10-22 DIAGNOSIS — Z8042 Family history of malignant neoplasm of prostate: Secondary | ICD-10-CM

## 2023-10-22 DIAGNOSIS — Z8546 Personal history of malignant neoplasm of prostate: Secondary | ICD-10-CM | POA: Diagnosis not present

## 2023-10-22 LAB — GENETIC SCREENING ORDER
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
## 2023-11-09 ENCOUNTER — Telehealth: Payer: Self-pay | Admitting: Genetic Counselor

## 2023-11-09 ENCOUNTER — Encounter: Payer: Self-pay | Admitting: Genetic Counselor

## 2023-11-09 DIAGNOSIS — Z1379 Encounter for other screening for genetic and chromosomal anomalies: Secondary | ICD-10-CM | POA: Insufficient documentation

## 2023-11-09 NOTE — Telephone Encounter (Addendum)
I attempted to contact Joel Burke to discuss his genetic testing results three times. He left me a voicemail yesterday stating we can release the results to his email. Since he did not answer my call today, I will release the results to his email and write a note in his chart explaining the results/upload the results and he should be able to view them because he has a MyChart. I left my phone number again in the voicemail so that he can call me if he has any questions.  Lalla Brothers, MS, [facility] Genetic Counselor [email or web address] (P) [phone]

## 2023-11-18 ENCOUNTER — Encounter: Payer: Self-pay | Admitting: Genetic Counselor

## 2023-11-27 ENCOUNTER — Ambulatory Visit: Payer: Self-pay | Admitting: Genetic Counselor

## 2023-11-27 DIAGNOSIS — Z1379 Encounter for other screening for genetic and chromosomal anomalies: Secondary | ICD-10-CM

## 2023-11-27 NOTE — Progress Notes (Signed)
HPI:   [name] was previously seen in the [facility] due to a personal and family history of cancer and concerns regarding a hereditary predisposition to cancer. Please refer to our prior cancer genetics clinic note for more information regarding our discussion, assessment and recommendations, at the time. [name] recent genetic test results were not disclosed via telephone due to multiple missed calls/voicemails. These results and recommendations are discussed in detail below.  CANCER HISTORY:  Oncology History  Malignant neoplasm of prostate (HCC)  11/29/2021 Cancer Staging   Staging form: Prostate, AJCC 8th Edition - Clinical stage from 11/29/2021: Stage IIB (cT1c, cN0, cM0, PSA: 5, Grade Group: 2) - Signed by Marcello Fennel, PA-C on 02/28/2022 Histopathologic type: Adenocarcinoma, NOS Stage prefix: Initial diagnosis Prostate specific antigen (PSA) range: Less than 10 Gleason primary pattern: 3 Gleason secondary pattern: 4 Gleason score: 7 Histologic grading system: 5 grade system Number of biopsy cores examined: 12 Number of biopsy cores positive: 2 Location of positive needle core biopsies: One side   02/28/2022 Initial Diagnosis   Malignant neoplasm of prostate (HCC)     FAMILY HISTORY:  We obtained a detailed, 4-generation family history.  Significant diagnoses are listed below:      Family History  Problem Relation Age of Onset   Hyperlipidemia Mother     Prostate cancer Father 51        metastatic   Colitis Brother     Crohn's disease Brother     Spondyloarthropathy Brother     Prostate cancer Paternal Grandfather 40 - 93   Prostate cancer Paternal Great-grandfather          grandfather's father   Colon cancer Neg Hx     Colon polyps Neg Hx     Esophageal cancer Neg Hx     Rectal cancer Neg Hx     Stomach cancer Neg Hx             [name] father was diagnosed with prostate cancer at age 8, he had a prostatectomy and radiation as  his treatment, he later died at age 52 due to metastatic prostate cancer. His paternal grandfather was diagnosed with prostate cancer in his late 80s/early 36s, he died at age 14. His paternal great grandfather (grandfather's father) was diagnosed with prostate cancer at an unknown age, he is deceased. [name] is unaware of previous family history of genetic testing for hereditary cancer risks. There is no reported Ashkenazi Jewish ancestry.  GENETIC TEST RESULTS:  The [facility] CancerNext-Expanded Panel found no pathogenic mutations.   The CancerNext-Expanded gene panel offered by [facility] and includes sequencing, rearrangement, and RNA analysis for the following 71 genes: AIP, ALK, APC, ATM, AXIN2, BAP1, BARD1, BMPR1A, BRCA1, BRCA2, BRIP1, CDC73, CDH1, CDK4, CDKN1B, CDKN2A, CHEK2, CTNNA1, DICER1, FH, FLCN, KIF1B, LZTR1, MAX, MEN1, MET, MLH1, MSH2, MSH3, MSH6, MUTYH, NF1, NF2, NTHL1, PALB2, PHOX2B, PMS2, POT1, PRKAR1A, PTCH1, PTEN, RAD51C, RAD51D, RB1, RET, SDHA, SDHAF2, SDHB, SDHC, SDHD, SMAD4, SMARCA4, SMARCB1, SMARCE1, STK11, SUFU, TMEM127, TP53, TSC1, TSC2, and VHL (sequencing and deletion/duplication); EGFR, EGLN1, HOXB13, KIT, MITF, PDGFRA, POLD1, and POLE (sequencing only); EPCAM and GREM1 (deletion/duplication only).   The test report has been scanned into EPIC and is located under the Molecular Pathology section of the Results Review tab.  A portion of the result report is included below for reference. Genetic testing reported out on 11/06/2023.        Even though a pathogenic variant was not identified,  possible explanations for the cancer in the family may include: There may be no hereditary risk for cancer in the family. The cancers in [name] and/or his family may be due to other genetic or environmental factors. There may be a gene mutation in one of these genes that current testing methods cannot detect, but that chance is small. There could be another gene that has not yet been  discovered, or that we have not yet tested, that is responsible for the cancer diagnoses in the family.  It is also possible there is a hereditary cause for the cancer in the family that [name] did not inherit.  Therefore, it is important to remain in touch with cancer genetics in the future so that we can continue to offer [name] the most up to date genetic testing.   ADDITIONAL GENETIC TESTING:  [name] genetic testing was fairly extensive.  If there are genes identified to increase cancer risk that can be analyzed in the future, we would be happy to discuss and coordinate this testing at that time.    CANCER SCREENING RECOMMENDATIONS:  [name] test result is considered negative (normal).  This means that we have not identified a hereditary cause for his personal and family history of cancer at this time.   An individual's cancer risk and medical management are not determined by genetic test results alone. Overall cancer risk assessment incorporates additional factors, including personal medical history, family history, and any available genetic information that may result in a personalized plan for cancer prevention and surveillance. Therefore, it is recommended he continue to follow the cancer management and screening guidelines provided by his oncology and primary healthcare provider.  RECOMMENDATIONS FOR FAMILY MEMBERS:   Since he did not inherit a mutation in a cancer predisposition gene included on this panel, his children could not have inherited a mutation from him in one of these genes. We recommend men in this family begin prostate cancer screening at age 3 or 33 years younger than the earliest onset of prostate cancer, whichever comes first. Based on his father's age at diagnosis, his sons should begin prostate cancer screening at age 69.  FOLLOW-UP:  Cancer genetics is a rapidly advancing field and it is possible that new genetic tests will be appropriate for him and/or  his family members in the future. We encouraged him to remain in contact with cancer genetics on an annual basis so we can update his personal and family histories and let him know of advances in cancer genetics that may benefit this family.   Our contact number was provided. [name] questions were answered to his satisfaction, and he knows he is welcome to call us at anytime with additional questions or concerns.   Lalla Brothers, MS, [facility] Genetic Counselor [email or web address] (P) [phone]

## 2024-01-08 DIAGNOSIS — H6122 Impacted cerumen, left ear: Secondary | ICD-10-CM | POA: Diagnosis not present

## 2024-01-08 DIAGNOSIS — H938X2 Other specified disorders of left ear: Secondary | ICD-10-CM | POA: Diagnosis not present

## 2024-04-19 ENCOUNTER — Other Ambulatory Visit: Payer: Self-pay

## 2024-04-19 DIAGNOSIS — C61 Malignant neoplasm of prostate: Secondary | ICD-10-CM

## 2024-04-20 LAB — PSA: Prostate Specific Ag, Serum: <0.1 ng/mL (ref 0.0–4.0)

## 2024-04-27 ENCOUNTER — Encounter: Payer: Self-pay | Admitting: Urology

## 2024-09-12 ENCOUNTER — Other Ambulatory Visit: Payer: Self-pay

## 2024-09-12 DIAGNOSIS — C61 Malignant neoplasm of prostate: Secondary | ICD-10-CM

## 2024-10-12 ENCOUNTER — Other Ambulatory Visit: Payer: Self-pay

## 2024-10-13 ENCOUNTER — Other Ambulatory Visit

## 2024-10-13 DIAGNOSIS — C61 Malignant neoplasm of prostate: Secondary | ICD-10-CM | POA: Diagnosis not present

## 2024-10-14 LAB — PSA: Prostate Specific Ag, Serum: <0.1 ng/mL (ref 0.0–4.0)
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
## 2024-10-18 DIAGNOSIS — Z1212 Encounter for screening for malignant neoplasm of rectum: Secondary | ICD-10-CM | POA: Diagnosis not present

## 2024-10-18 DIAGNOSIS — Z0189 Encounter for other specified special examinations: Secondary | ICD-10-CM | POA: Diagnosis not present

## 2024-10-18 DIAGNOSIS — Z125 Encounter for screening for malignant neoplasm of prostate: Secondary | ICD-10-CM | POA: Diagnosis not present

## 2024-10-19 ENCOUNTER — Encounter: Payer: Self-pay | Admitting: Urology

## 2024-10-19 ENCOUNTER — Ambulatory Visit: Admitting: Urology

## 2024-10-19 ENCOUNTER — Ambulatory Visit: Payer: Self-pay | Admitting: Urology

## 2024-10-19 VITALS — BP 132/83 | HR 95 | Ht 71.0 in | Wt 180.0 lb

## 2024-10-19 DIAGNOSIS — N5231 Erectile dysfunction following radical prostatectomy: Secondary | ICD-10-CM | POA: Diagnosis not present

## 2024-10-19 DIAGNOSIS — C61 Malignant neoplasm of prostate: Secondary | ICD-10-CM

## 2024-10-19 DIAGNOSIS — N393 Stress incontinence (female) (male): Secondary | ICD-10-CM | POA: Diagnosis not present

## 2024-10-19 MED ORDER — TADALAFIL 5 MG PO TABS: 5.0000 mg | ORAL_TABLET | Freq: Every day | ORAL | 3 refills | Status: AC

## 2024-10-19 MED ORDER — TADALAFIL 20 MG PO TABS: ORAL_TABLET | ORAL | 3 refills | Status: AC

## 2024-10-23 DIAGNOSIS — Z125 Encounter for screening for malignant neoplasm of prostate: Secondary | ICD-10-CM | POA: Diagnosis not present

## 2024-10-23 DIAGNOSIS — Z0189 Encounter for other specified special examinations: Secondary | ICD-10-CM | POA: Diagnosis not present

## 2024-10-24 DIAGNOSIS — E785 Hyperlipidemia, unspecified: Secondary | ICD-10-CM | POA: Diagnosis not present

## 2024-10-24 DIAGNOSIS — Z23 Encounter for immunization: Secondary | ICD-10-CM | POA: Diagnosis not present

## 2024-10-24 DIAGNOSIS — I2510 Atherosclerotic heart disease of native coronary artery without angina pectoris: Secondary | ICD-10-CM | POA: Diagnosis not present

## 2024-10-24 DIAGNOSIS — K219 Gastro-esophageal reflux disease without esophagitis: Secondary | ICD-10-CM | POA: Diagnosis not present

## 2024-10-24 DIAGNOSIS — R0683 Snoring: Secondary | ICD-10-CM | POA: Diagnosis not present

## 2024-10-24 DIAGNOSIS — Z8042 Family history of malignant neoplasm of prostate: Secondary | ICD-10-CM | POA: Diagnosis not present

## 2024-10-24 DIAGNOSIS — Z1331 Encounter for screening for depression: Secondary | ICD-10-CM | POA: Diagnosis not present

## 2024-10-24 DIAGNOSIS — Z8546 Personal history of malignant neoplasm of prostate: Secondary | ICD-10-CM | POA: Diagnosis not present

## 2024-10-24 DIAGNOSIS — Z2839 Other underimmunization status: Secondary | ICD-10-CM | POA: Diagnosis not present

## 2024-10-24 DIAGNOSIS — N529 Male erectile dysfunction, unspecified: Secondary | ICD-10-CM | POA: Diagnosis not present

## 2024-10-24 DIAGNOSIS — R82998 Other abnormal findings in urine: Secondary | ICD-10-CM | POA: Diagnosis not present

## 2024-10-24 DIAGNOSIS — Z0000 Encounter for general adult medical examination without abnormal findings: Secondary | ICD-10-CM | POA: Diagnosis not present

## 2024-10-24 DIAGNOSIS — R7301 Impaired fasting glucose: Secondary | ICD-10-CM | POA: Diagnosis not present

## 2024-11-14 DIAGNOSIS — R21 Rash and other nonspecific skin eruption: Secondary | ICD-10-CM | POA: Diagnosis not present

## 2024-11-14 DIAGNOSIS — T63484A Toxic effect of venom of other arthropod, undetermined, initial encounter: Secondary | ICD-10-CM | POA: Diagnosis not present

## 2024-11-14 DIAGNOSIS — R600 Localized edema: Secondary | ICD-10-CM | POA: Diagnosis not present

## 2025-04-20 ENCOUNTER — Other Ambulatory Visit

## 2025-04-27 ENCOUNTER — Ambulatory Visit: Admitting: Urology
